# Patient Record
Sex: Female | Born: 1965 | Race: White | Hispanic: No | Marital: Married | Smoking: Never smoker
Health system: Southern US, Community
[De-identification: ages and names within clinical notes are randomized; demographics above are authoritative.]

## PROBLEM LIST (undated history)

## (undated) DIAGNOSIS — F509 Eating disorder, unspecified: Secondary | ICD-10-CM

## (undated) DIAGNOSIS — F32A Depression, unspecified: Secondary | ICD-10-CM

## (undated) DIAGNOSIS — E7800 Pure hypercholesterolemia, unspecified: Secondary | ICD-10-CM

## (undated) DIAGNOSIS — C439 Malignant melanoma of skin, unspecified: Secondary | ICD-10-CM

## (undated) DIAGNOSIS — K219 Gastro-esophageal reflux disease without esophagitis: Secondary | ICD-10-CM

## (undated) DIAGNOSIS — F329 Major depressive disorder, single episode, unspecified: Secondary | ICD-10-CM

## (undated) DIAGNOSIS — I10 Essential (primary) hypertension: Secondary | ICD-10-CM

## (undated) HISTORY — DX: Malignant melanoma of skin, unspecified: C43.9

## (undated) HISTORY — DX: Depression, unspecified: F32.A

## (undated) HISTORY — DX: Eating disorder, unspecified: F50.9

## (undated) HISTORY — DX: Gastro-esophageal reflux disease without esophagitis: K21.9

## (undated) HISTORY — PX: OTHER SURGICAL HISTORY: SHX169

---
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
## 1995-11-10 HISTORY — PX: TUBAL LIGATION: SHX77

## 2012-07-29 ENCOUNTER — Ambulatory Visit: Payer: 59

## 2012-07-29 ENCOUNTER — Ambulatory Visit (INDEPENDENT_AMBULATORY_CARE_PROVIDER_SITE_OTHER): Payer: 59 | Admitting: Family Medicine

## 2012-07-29 VITALS — BP 128/88 | HR 65 | Temp 98.2°F | Resp 16 | Ht 67.0 in | Wt 173.0 lb

## 2012-07-29 DIAGNOSIS — J9801 Acute bronchospasm: Secondary | ICD-10-CM

## 2012-07-29 DIAGNOSIS — S29011A Strain of muscle and tendon of front wall of thorax, initial encounter: Secondary | ICD-10-CM

## 2012-07-29 DIAGNOSIS — R05 Cough: Secondary | ICD-10-CM

## 2012-07-29 DIAGNOSIS — R059 Cough, unspecified: Secondary | ICD-10-CM

## 2012-07-29 DIAGNOSIS — S239XXA Sprain of unspecified parts of thorax, initial encounter: Secondary | ICD-10-CM

## 2012-07-29 DIAGNOSIS — J209 Acute bronchitis, unspecified: Secondary | ICD-10-CM

## 2012-08-01 ENCOUNTER — Encounter: Payer: Self-pay | Admitting: Family Medicine

## 2012-08-01 NOTE — Progress Notes (Signed)
[address]   [place], [place]  [address]   [phone]  Subjective:    Patient ID: Donna Vega, female    DOB: Jul 27, 1966, 46 y.o.   MRN: [id number]  HPIThis 46 y.o. female presents for evaluation of cough, chest pain.  Onset one week ago with chest congestion.  No fever/chills/sweats.  +HA mild; no sore throat or ear pain.  +PND; no rhinorrhea; +nasal congestion.  No sneezing; no history of seasonal allergies.  Works in Radio broadcast assistant.  No n/v/d. No rash.  Taking Dayquil.  +chest pain and upper back pain started three days ago; occurs with coughing, rolling over, lying in bed.  No pain with deep breathing.  Sharp pain.  No SOB/DOE.  No history of asthma; +wheezing with acute illness.  No leg swelling.    PCP: none.   PMH: none Psurg: BTL All: NKDA Medications: Topamax 50mg  bid started last week for overweight status by NP at [facility]. Social:  No tobacco/ETOH/drugs; works in Radio broadcast assistant.  No exercise..   Review of Systems  Constitutional: Negative for fever, chills and fatigue.  HENT: Positive for congestion and postnasal drip. Negative for sore throat, rhinorrhea, sneezing, trouble swallowing and voice change.   Respiratory: Positive for cough and wheezing. Negative for shortness of breath.   Cardiovascular: Positive for chest pain. Negative for palpitations and leg swelling.  Gastrointestinal: Negative for nausea, vomiting, diarrhea and constipation.  Skin: Negative for rash.  Neurological: Negative for headaches.    No past medical history on file.  Past Surgical History  Procedure Date  . Bilateral tubal liga     Prior to Admission medications   Medication Sig Start Date End Date Taking? Authorizing Provider  topiramate (TOPAMAX) 50 MG tablet Take 50 mg by mouth 2 (two) times daily.   Yes Historical Provider, MD    No Known Allergies  History   Social History  . Marital Status: Married    Spouse Name: N/A    Number of Children: N/A  . Years of  Education: N/A   Occupational History  . Not on file.   Social History Main Topics  . Smoking status: Never Smoker   . Smokeless tobacco: Not on file  . Alcohol Use: No  . Drug Use: No  . Sexually Active: Yes -- Female partner(s)   Other Topics Concern  . Not on file   Social History Narrative  . No narrative on file    No family history on file.     Objective:   Physical Exam  Nursing note and vitals reviewed. Constitutional: She is oriented to person, place, and time. She appears well-developed and well-nourished. No distress.  HENT:  Head: Normocephalic and atraumatic.  Right Ear: External ear normal.  Left Ear: External ear normal.  Nose: Nose normal.  Mouth/Throat: Oropharynx is clear and moist. No oropharyngeal exudate.  Eyes: Conjunctivae normal and EOM are normal. Pupils are equal, round, and reactive to light.  Neck: Normal range of motion. Neck supple. No thyromegaly present.  Cardiovascular: Normal rate, regular rhythm, normal heart sounds and intact distal pulses.  Exam reveals no gallop and no friction rub.   No murmur heard. Pulmonary/Chest: Effort normal and breath sounds normal. No respiratory distress. She has no wheezes. She has no rales.  Abdominal: Soft. Bowel sounds are normal. She exhibits no distension. There is no tenderness.  Lymphadenopathy:    She has no cervical adenopathy.  Neurological: She is alert and oriented to person, place, and  time.  Skin: Skin is warm and dry. She is not diaphoretic.  Psychiatric: She has a normal mood and affect. Her behavior is normal. Judgment and thought content normal.   [facility] reading (PRIMARY) by  Dr. Katrinka Blazing.  CXR: NAD  PROCEDURE: ALBUTEROL NEBULIZER X 1 ADMINISTERED DURING VISIT.     Assessment & Plan:   1. Cough  DG Chest 2 View  2. Acute bronchitis    3. Bronchospasm    4. Chest wall muscle strain       1.  Acute Bronchitis: New.  Rx for Doxycycline 100mg  bid x 10 days #20 no refills.  Mucinex DM  bid PRN cough.  RTC for acute worsening or lack of improvement. 2. Bronchospasm:  New.  S/p albuterol nebulizer in office. Rx for Prednisone 20mg  two daily x 5 days then one daily x 5 days #15 no refills. 3.  Chest wall muscle strain: New. Secondary to coughing; recommend Tylenol PRN.  Rx for Prednisone provided.

## 2012-08-02 NOTE — Progress Notes (Signed)
Reviewed and agree.

## 2012-08-05 ENCOUNTER — Encounter: Payer: Self-pay | Admitting: Family Medicine

## 2012-09-29 ENCOUNTER — Ambulatory Visit: Payer: Self-pay | Admitting: Family Medicine

## 2012-10-03 ENCOUNTER — Encounter: Payer: Self-pay | Admitting: Family Medicine

## 2012-10-03 ENCOUNTER — Ambulatory Visit (INDEPENDENT_AMBULATORY_CARE_PROVIDER_SITE_OTHER): Payer: 59 | Admitting: Family Medicine

## 2012-10-03 VITALS — BP 120/80 | HR 79 | Resp 15 | Ht 68.0 in | Wt 174.1 lb

## 2012-10-03 DIAGNOSIS — Z733 Stress, not elsewhere classified: Secondary | ICD-10-CM

## 2012-10-03 DIAGNOSIS — Z1239 Encounter for other screening for malignant neoplasm of breast: Secondary | ICD-10-CM

## 2012-10-03 DIAGNOSIS — F439 Reaction to severe stress, unspecified: Secondary | ICD-10-CM

## 2012-10-03 DIAGNOSIS — F509 Eating disorder, unspecified: Secondary | ICD-10-CM

## 2012-10-03 NOTE — Progress Notes (Signed)
  Subjective:    Patient ID: Donna Vega, female    DOB: 02/24/1966, 46 y.o.   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 She recently asked her GYN for help with weight loss and was prescribed topamax, they were not aware she had eating disorder, no weight was lost with the medication and she stopped it.  Does not exercise, her diet is not very well controlled, eats snack and junk food, drinks 2-3 sodas a day Her 50 year old daughter is having a baby unexpected and out of wedlock and this stresses her some She has LSIL on PAP and has to return every 6 months , has had colpo she does not want to go to doctor including GYN. Only scheduled with me as she had URI last month and had no PCP to see Also would like mammogram scheduled Had labs done with GYN and wellness program for work  LPN  Review of Systems  GEN- denies fatigue, fever, weight loss,weakness, recent illness HEENT- denies eye drainage, change in vision, nasal discharge, CVS- denies chest pain, palpitations RESP- denies SOB, cough, wheeze ABD- denies N/V, change in stools, abd pain GU- denies dysuria, hematuria, dribbling, incontinence MSK- denies joint pain, muscle aches, injury Neuro- denies headache, dizziness, syncope, seizure activity      Objective:   Physical Exam GEN- NAD, alert and oriented x3 HEENT- PERRL, EOMI, non injected sclera, pink conjunctiva, MMM, oropharynx clear Neck- Supple, no thyromegaly CVS- RRR, no murmur RESP-CTAB ABS-NABS,soft,NT,ND EXT- No edema Pulses- Radial,  Psych- a little anxious appearing, gets flushed when nervous, not depressed  appearing, not chronically ill appearing         Assessment & Plan:

## 2012-10-03 NOTE — Assessment & Plan Note (Signed)
She declines any therapy, counseling or treatment, her BMI is within the normal range, approx 15  Minutes spent on eating healthy foods and cutting out junk foods to maintain a good weight

## 2012-10-03 NOTE — Patient Instructions (Addendum)
Schedule your Mammogram I will get your records Send me Your records from cholesterol screen Remember you are a beautiful woman! F/U yearly or as needed

## 2012-10-03 NOTE — Assessment & Plan Note (Signed)
New stressor with daughters pregnancy. She was treated for depression in the past after a failed marriage There is also some stress at work.

## 2012-11-22 ENCOUNTER — Telehealth: Payer: Self-pay | Admitting: Family Medicine

## 2012-11-25 NOTE — Telephone Encounter (Signed)
Called and left message for patient to return call Needs appointment.

## 2016-05-11 DIAGNOSIS — E6609 Other obesity due to excess calories: Secondary | ICD-10-CM | POA: Diagnosis not present

## 2016-05-11 DIAGNOSIS — Z0000 Encounter for general adult medical examination without abnormal findings: Secondary | ICD-10-CM | POA: Diagnosis not present

## 2016-05-15 DIAGNOSIS — E6609 Other obesity due to excess calories: Secondary | ICD-10-CM | POA: Diagnosis not present

## 2016-05-15 DIAGNOSIS — Z0000 Encounter for general adult medical examination without abnormal findings: Secondary | ICD-10-CM | POA: Diagnosis not present

## 2016-05-22 DIAGNOSIS — E782 Mixed hyperlipidemia: Secondary | ICD-10-CM | POA: Diagnosis not present

## 2016-05-25 DIAGNOSIS — Z0000 Encounter for general adult medical examination without abnormal findings: Secondary | ICD-10-CM | POA: Diagnosis not present

## 2016-08-17 DIAGNOSIS — Z1151 Encounter for screening for human papillomavirus (HPV): Secondary | ICD-10-CM | POA: Diagnosis not present

## 2016-08-17 DIAGNOSIS — Z01419 Encounter for gynecological examination (general) (routine) without abnormal findings: Secondary | ICD-10-CM | POA: Diagnosis not present

## 2016-08-17 DIAGNOSIS — Z6830 Body mass index (BMI) 30.0-30.9, adult: Secondary | ICD-10-CM | POA: Diagnosis not present

## 2016-08-26 DIAGNOSIS — Z0189 Encounter for other specified special examinations: Secondary | ICD-10-CM | POA: Diagnosis not present

## 2016-08-26 DIAGNOSIS — E6609 Other obesity due to excess calories: Secondary | ICD-10-CM | POA: Diagnosis not present

## 2016-11-16 DIAGNOSIS — Z6830 Body mass index (BMI) 30.0-30.9, adult: Secondary | ICD-10-CM | POA: Diagnosis not present

## 2016-11-16 DIAGNOSIS — Z713 Dietary counseling and surveillance: Secondary | ICD-10-CM | POA: Diagnosis not present

## 2016-12-02 ENCOUNTER — Other Ambulatory Visit (HOSPITAL_COMMUNITY): Payer: Self-pay | Admitting: Internal Medicine

## 2016-12-02 DIAGNOSIS — Z1231 Encounter for screening mammogram for malignant neoplasm of breast: Secondary | ICD-10-CM

## 2016-12-07 ENCOUNTER — Ambulatory Visit (HOSPITAL_COMMUNITY): Payer: 59

## 2017-02-26 ENCOUNTER — Encounter (HOSPITAL_COMMUNITY): Payer: Self-pay | Admitting: Radiology

## 2017-02-26 ENCOUNTER — Ambulatory Visit (HOSPITAL_COMMUNITY)
Admission: RE | Admit: 2017-02-26 | Discharge: 2017-02-26 | Disposition: A | Discharge status: Home / Self Care | Payer: 59 | Source: Ambulatory Visit | Attending: Internal Medicine | Admitting: Internal Medicine

## 2017-02-26 DIAGNOSIS — Z1231 Encounter for screening mammogram for malignant neoplasm of breast: Secondary | ICD-10-CM | POA: Diagnosis not present

## 2017-06-02 DIAGNOSIS — Z0000 Encounter for general adult medical examination without abnormal findings: Secondary | ICD-10-CM | POA: Diagnosis not present

## 2017-06-25 DIAGNOSIS — E782 Mixed hyperlipidemia: Secondary | ICD-10-CM | POA: Diagnosis not present

## 2017-06-25 DIAGNOSIS — Z0001 Encounter for general adult medical examination with abnormal findings: Secondary | ICD-10-CM | POA: Diagnosis not present

## 2017-06-25 DIAGNOSIS — Z6829 Body mass index (BMI) 29.0-29.9, adult: Secondary | ICD-10-CM | POA: Diagnosis not present

## 2017-06-25 DIAGNOSIS — Z713 Dietary counseling and surveillance: Secondary | ICD-10-CM | POA: Diagnosis not present

## 2017-08-04 DIAGNOSIS — N951 Menopausal and female climacteric states: Secondary | ICD-10-CM | POA: Diagnosis not present

## 2017-08-04 DIAGNOSIS — Z6829 Body mass index (BMI) 29.0-29.9, adult: Secondary | ICD-10-CM | POA: Diagnosis not present

## 2017-08-04 DIAGNOSIS — Z713 Dietary counseling and surveillance: Secondary | ICD-10-CM | POA: Diagnosis not present

## 2017-09-06 DIAGNOSIS — N951 Menopausal and female climacteric states: Secondary | ICD-10-CM | POA: Diagnosis not present

## 2017-10-05 DIAGNOSIS — N951 Menopausal and female climacteric states: Secondary | ICD-10-CM | POA: Diagnosis not present

## 2017-10-05 DIAGNOSIS — Z6830 Body mass index (BMI) 30.0-30.9, adult: Secondary | ICD-10-CM | POA: Diagnosis not present

## 2017-10-05 DIAGNOSIS — F3489 Other specified persistent mood disorders: Secondary | ICD-10-CM | POA: Diagnosis not present

## 2017-12-17 DIAGNOSIS — N951 Menopausal and female climacteric states: Secondary | ICD-10-CM | POA: Diagnosis not present

## 2017-12-17 DIAGNOSIS — Z713 Dietary counseling and surveillance: Secondary | ICD-10-CM | POA: Diagnosis not present

## 2017-12-17 DIAGNOSIS — Z6830 Body mass index (BMI) 30.0-30.9, adult: Secondary | ICD-10-CM | POA: Diagnosis not present

## 2017-12-31 DIAGNOSIS — Z01419 Encounter for gynecological examination (general) (routine) without abnormal findings: Secondary | ICD-10-CM | POA: Diagnosis not present

## 2017-12-31 DIAGNOSIS — D2272 Melanocytic nevi of left lower limb, including hip: Secondary | ICD-10-CM | POA: Diagnosis not present

## 2017-12-31 DIAGNOSIS — N951 Menopausal and female climacteric states: Secondary | ICD-10-CM | POA: Diagnosis not present

## 2018-02-21 DIAGNOSIS — C4372 Malignant melanoma of left lower limb, including hip: Secondary | ICD-10-CM | POA: Diagnosis not present

## 2018-03-07 DIAGNOSIS — C4372 Malignant melanoma of left lower limb, including hip: Secondary | ICD-10-CM | POA: Diagnosis not present

## 2018-03-07 DIAGNOSIS — D487 Neoplasm of uncertain behavior of other specified sites: Secondary | ICD-10-CM | POA: Diagnosis not present

## 2018-03-25 DIAGNOSIS — E782 Mixed hyperlipidemia: Secondary | ICD-10-CM | POA: Diagnosis not present

## 2018-03-25 DIAGNOSIS — I10 Essential (primary) hypertension: Secondary | ICD-10-CM | POA: Diagnosis not present

## 2018-03-30 DIAGNOSIS — N951 Menopausal and female climacteric states: Secondary | ICD-10-CM | POA: Diagnosis not present

## 2018-03-30 DIAGNOSIS — E782 Mixed hyperlipidemia: Secondary | ICD-10-CM | POA: Diagnosis not present

## 2018-03-30 DIAGNOSIS — F331 Major depressive disorder, recurrent, moderate: Secondary | ICD-10-CM | POA: Diagnosis not present

## 2018-04-29 DIAGNOSIS — I10 Essential (primary) hypertension: Secondary | ICD-10-CM | POA: Diagnosis not present

## 2018-04-29 DIAGNOSIS — E782 Mixed hyperlipidemia: Secondary | ICD-10-CM | POA: Diagnosis not present

## 2018-04-29 DIAGNOSIS — F331 Major depressive disorder, recurrent, moderate: Secondary | ICD-10-CM | POA: Diagnosis not present

## 2018-04-29 DIAGNOSIS — N951 Menopausal and female climacteric states: Secondary | ICD-10-CM | POA: Diagnosis not present

## 2018-05-20 DIAGNOSIS — F331 Major depressive disorder, recurrent, moderate: Secondary | ICD-10-CM | POA: Diagnosis not present

## 2018-05-20 DIAGNOSIS — N951 Menopausal and female climacteric states: Secondary | ICD-10-CM | POA: Diagnosis not present

## 2018-05-20 DIAGNOSIS — Z6833 Body mass index (BMI) 33.0-33.9, adult: Secondary | ICD-10-CM | POA: Diagnosis not present

## 2018-05-20 DIAGNOSIS — F3489 Other specified persistent mood disorders: Secondary | ICD-10-CM | POA: Diagnosis not present

## 2019-07-10 DIAGNOSIS — Z85820 Personal history of malignant melanoma of skin: Secondary | ICD-10-CM | POA: Diagnosis not present

## 2019-07-10 DIAGNOSIS — F329 Major depressive disorder, single episode, unspecified: Secondary | ICD-10-CM | POA: Diagnosis present

## 2019-07-10 DIAGNOSIS — F32A Depression, unspecified: Secondary | ICD-10-CM | POA: Diagnosis present

## 2019-07-10 DIAGNOSIS — Z01411 Encounter for gynecological examination (general) (routine) with abnormal findings: Secondary | ICD-10-CM | POA: Diagnosis not present

## 2019-08-07 DIAGNOSIS — Z6834 Body mass index (BMI) 34.0-34.9, adult: Secondary | ICD-10-CM | POA: Diagnosis not present

## 2019-08-07 DIAGNOSIS — E669 Obesity, unspecified: Secondary | ICD-10-CM | POA: Diagnosis not present

## 2019-08-07 DIAGNOSIS — F329 Major depressive disorder, single episode, unspecified: Secondary | ICD-10-CM | POA: Diagnosis not present

## 2019-08-07 DIAGNOSIS — N951 Menopausal and female climacteric states: Secondary | ICD-10-CM | POA: Diagnosis not present

## 2019-09-04 DIAGNOSIS — Z6832 Body mass index (BMI) 32.0-32.9, adult: Secondary | ICD-10-CM | POA: Diagnosis not present

## 2019-09-04 DIAGNOSIS — F329 Major depressive disorder, single episode, unspecified: Secondary | ICD-10-CM | POA: Diagnosis not present

## 2019-09-04 DIAGNOSIS — K2970 Gastritis, unspecified, without bleeding: Secondary | ICD-10-CM | POA: Diagnosis not present

## 2019-09-04 DIAGNOSIS — E669 Obesity, unspecified: Secondary | ICD-10-CM | POA: Diagnosis not present

## 2019-09-05 ENCOUNTER — Encounter: Payer: Self-pay | Admitting: Internal Medicine

## 2019-09-20 ENCOUNTER — Ambulatory Visit: Payer: 59 | Admitting: Gastroenterology

## 2019-09-20 ENCOUNTER — Other Ambulatory Visit: Payer: Self-pay

## 2019-09-20 ENCOUNTER — Encounter: Payer: Self-pay | Admitting: Gastroenterology

## 2019-09-20 ENCOUNTER — Telehealth: Payer: Self-pay | Admitting: *Deleted

## 2019-09-20 ENCOUNTER — Encounter: Payer: Self-pay | Admitting: *Deleted

## 2019-09-20 VITALS — BP 151/97 | HR 61 | Temp 97.2°F | Ht 68.0 in | Wt 213.2 lb

## 2019-09-20 DIAGNOSIS — R1013 Epigastric pain: Secondary | ICD-10-CM | POA: Diagnosis not present

## 2019-09-20 MED ORDER — ONDANSETRON 4 MG PO TBDP: 4.0000 mg | ORAL_TABLET | Freq: Three times a day (TID) | ORAL | 1 refills | Status: DC | PRN

## 2019-09-20 NOTE — Patient Instructions (Signed)
Please have labs completed today. We will call with results.  We are ordering a CT to be done in the next 24-48 hours.   Continue Protonix as you are doing.  I have sent in Zofran for nausea to take every 8 hours as needed.  We will be in close contact with next steps!  It was a pleasure to see you today. I want to create trusting relationships with patients to provide genuine, compassionate, and quality care. I value your feedback. If you receive a survey regarding your visit,  I greatly appreciate you taking time to fill this out.   Annitta Needs, PhD, ANP-BC [facility]

## 2019-09-20 NOTE — Progress Notes (Signed)
Primary Care Physician:  Donna Fantasia, NP  Referring Provider: Bryan Lemma, NP Primary Gastroenterologist:  Dr. Gala Donna Vega   Chief Complaint  Patient presents with  . GASTRITIS    F/U.    HPI:   Donna Donna Vega is a 53 y.o. female presenting today at the request of Donna Lemma, NP, due to abdominal pain and concern for gastritis.   Notes onset of epigastric pain one month ago. Intermittently occurring and unable to lay on either side when pain hits. If lays on left side will vomit. Associated nausea with eating. Decreased appetite. Self-reported weight loss of 12 lbs over the past month. Notes intermittent pruritis, no rash. No jaundice. Feels fatigued and tired. Several days ago noted gray/white colored stool that has persisted. Stool floats. Urine is dark intermittently. Stool floats. No changes in bowel habits. Chronic GERD with Protonix providing relief to reflux symptoms. Pain usually starts later in the evening prior to going to bed. Feels very fatigued and tired.   No recent labs. No imaging on file. No prior colonoscopy/EGD. Mom with history of colon cancer in her 85s. No family history of liver disease.   Past Medical History:  Diagnosis Date  . Depression   . Eating disorder   . GERD (gastroesophageal reflux disease)   . 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 TUBAL LIGATION  1997    Current Outpatient Medications  Medication Sig Dispense Refill  . pantoprazole (PROTONIX) 20 MG tablet Take 1 tablet by mouth daily.    Marland Kitchen PARoxetine (PAXIL) 20 MG tablet Take 1 tablet by mouth daily.    . ondansetron (ZOFRAN ODT) 4 MG disintegrating tablet Take 1 tablet (4 mg total) by mouth every 8 (eight) hours as needed for nausea or vomiting. 60 tablet 1   No current facility-administered medications for this visit.     Allergies as of 09/20/2019  . (No Known Allergies)    Family History  Problem Relation Age of Onset  . Colon  cancer Mother 4       colon   . Hypertension Father   . Hyperlipidemia Father   . Liver disease Neg Hx     Social History   Socioeconomic History  . Marital status: Married    Spouse name: Not on file  . Number of children: Not on file  . Years of education: Not on file  . Highest education level: Not on file  Occupational History  . Not on file  Social Donna Vega  . Financial resource strain: Not on file  . Food insecurity    Worry: Not on file    Inability: Not on file  . Transportation Donna Vega    Medical: Not on file    Non-medical: Not on file  Tobacco Use  . Smoking status: Never Smoker  . Smokeless tobacco: Never Used  Substance and Sexual Activity  . Alcohol use: Not Currently    Comment: 2-3 beers after each evening for 6 years  . Drug use: No  . Sexual activity: Yes    Partners: Male  Lifestyle  . Physical activity    Days per week: Not on file    Minutes per session: Not on file  . Stress: Not on file  Relationships  . Social Herbalist on phone: Not on file    Gets together: Not on file    Attends religious service: Not on file  Active member of club or organization: Not on file    Attends meetings of clubs or organizations: Not on file    Relationship status: Not on file  . Intimate partner violence    Fear of current or ex partner: Not on file    Emotionally abused: Not on file    Physically abused: Not on file    Forced sexual activity: Not on file  Other Topics Concern  . Not on file  Social History Narrative  . Not on file    Review of Systems: Gen: see HPI CV: Denies chest pain, heart palpitations, peripheral edema, syncope.  Resp: Denies shortness of breath at rest or with exertion. Denies wheezing or cough.  GI: see HPI GU : Denies urinary burning, urinary frequency, urinary hesitancy MS: Denies joint pain, muscle weakness, cramps, or limitation of movement.  Derm: see HPI Psych: Denies depression, anxiety, memory loss, and  confusion Heme: Denies bruising, bleeding, and enlarged lymph nodes.  Physical Exam: BP (!) 151/97   Pulse 61   Temp (!) 97.2 F (36.2 C) (Oral)   Ht 5\' 8"  (1.727 m)   Wt 213 lb 3.2 oz (96.7 kg)   BMI 32.42 kg/m  General:   Alert and oriented. Pleasant and cooperative. Well-nourished and well-developed.  Head:  Normocephalic and atraumatic. Eyes:  Without icterus, sclera clear and conjunctiva pink.  Ears:  Normal auditory acuity. Lungs:  Clear to auscultation bilaterally. No wheezes, rales, or rhonchi. No distress.  Heart:  S1, S2 present without murmurs appreciated.  Abdomen:  +BS, soft, non-tender and non-distended. No HSM noted. No guarding or rebound. No masses appreciated.  Rectal:  Deferred  Msk:  Symmetrical without gross deformities. Normal posture. Extremities:  Without edema. Neurologic:  Alert and  oriented x4 Psych:  Alert and cooperative. Normal mood and affect.  ASSESSMENT/PLAN: Donna Donna Vega is a 53 y.o. female presenting today with acute onset of epigastric pain, nausea, vomiting, loss of appetite one month ago, now reporting clay/white-colored stools, pruritis, intermittently dark urine. Self-reported weight loss of 12 lbs recently. No recent labs on file. No imaging thus far. Family history significant for colon cancer in mother in her 62s. Patient with personal history of left leg melanoma. No known liver disease.  Presentation concerning. Ordering CBC, BMP, HFP, and pursuing stat CT abd/pelvis. Zofran provided for nausea. Further recommendations to follow once we receive labs and imaging.   Donna Needs, PhD, ANP-BC [facility]

## 2019-09-20 NOTE — Telephone Encounter (Signed)
Called [facility] and was advised no PA required for CT A/P.

## 2019-09-21 ENCOUNTER — Ambulatory Visit (HOSPITAL_COMMUNITY)
Admission: RE | Admit: 2019-09-21 | Discharge: 2019-09-21 | Disposition: A | Discharge status: Home / Self Care | Payer: 59 | Source: Ambulatory Visit | Attending: Gastroenterology | Admitting: Gastroenterology

## 2019-09-21 DIAGNOSIS — R1110 Vomiting, unspecified: Secondary | ICD-10-CM | POA: Diagnosis not present

## 2019-09-21 DIAGNOSIS — R1013 Epigastric pain: Secondary | ICD-10-CM | POA: Insufficient documentation

## 2019-09-21 LAB — CBC WITH DIFFERENTIAL/PLATELET
Absolute Monocytes: 450 cells/uL (ref 200–950)
Basophils Absolute: 48 cells/uL (ref 0–200)
Basophils Relative: 0.8 %
Eosinophils Absolute: 120 cells/uL (ref 15–500)
Eosinophils Relative: 2 %
HCT: 46.2 % — ABNORMAL HIGH (ref 35.0–45.0)
Hemoglobin: 15.4 g/dL (ref 11.7–15.5)
Lymphs Abs: 1746 cells/uL (ref 850–3900)
MCH: 29.8 pg (ref 27.0–33.0)
MCHC: 33.3 g/dL (ref 32.0–36.0)
MCV: 89.4 fL (ref 80.0–100.0)
MPV: 11.1 fL (ref 7.5–12.5)
Monocytes Relative: 7.5 %
Neutro Abs: 3636 cells/uL (ref 1500–7800)
Neutrophils Relative %: 60.6 %
Platelets: 241 10*3/uL (ref 140–400)
RBC: 5.17 10*6/uL — ABNORMAL HIGH (ref 3.80–5.10)
RDW: 12.3 % (ref 11.0–15.0)
Total Lymphocyte: 29.1 %
WBC: 6 10*3/uL (ref 3.8–10.8)

## 2019-09-21 LAB — HEPATIC FUNCTION PANEL
AG Ratio: 1.5 (calc) (ref 1.0–2.5)
ALT: 259 U/L — ABNORMAL HIGH (ref 6–29)
AST: 107 U/L — ABNORMAL HIGH (ref 10–35)
Albumin: 4.6 g/dL (ref 3.6–5.1)
Alkaline phosphatase (APISO): 353 U/L — ABNORMAL HIGH (ref 37–153)
Bilirubin, Direct: 0.3 mg/dL — ABNORMAL HIGH (ref 0.0–0.2)
Globulin: 3.1 g/dL (calc) (ref 1.9–3.7)
Indirect Bilirubin: 0.7 mg/dL (calc) (ref 0.2–1.2)
Total Bilirubin: 1 mg/dL (ref 0.2–1.2)
Total Protein: 7.7 g/dL (ref 6.1–8.1)

## 2019-09-21 LAB — BASIC METABOLIC PANEL WITH GFR
BUN: 14 mg/dL (ref 7–25)
CO2: 25 mmol/L (ref 20–32)
Calcium: 9.9 mg/dL (ref 8.6–10.4)
Chloride: 106 mmol/L (ref 98–110)
Creat: 0.82 mg/dL (ref 0.50–1.05)
GFR, Est African American: 96 mL/min/{1.73_m2} (ref >=60)
GFR, Est Non African American: 83 mL/min/{1.73_m2} (ref >=60)
Glucose, Bld: 109 mg/dL (ref 65–139)
Potassium: 4.8 mmol/L (ref 3.5–5.3)
Sodium: 138 mmol/L (ref 135–146)

## 2019-09-21 MED ORDER — IOHEXOL 300 MG/ML  SOLN
100.0000 mL | Freq: Once | INTRAMUSCULAR | Status: AC | PRN
  Administered 2019-09-21: 100 mL via INTRAVENOUS

## 2019-09-25 ENCOUNTER — Other Ambulatory Visit: Payer: Self-pay

## 2019-09-25 ENCOUNTER — Other Ambulatory Visit: Payer: Self-pay | Admitting: Gastroenterology

## 2019-09-25 ENCOUNTER — Other Ambulatory Visit: Payer: Self-pay | Admitting: *Deleted

## 2019-09-25 DIAGNOSIS — R1013 Epigastric pain: Secondary | ICD-10-CM

## 2019-09-26 ENCOUNTER — Other Ambulatory Visit: Payer: Self-pay | Admitting: *Deleted

## 2019-09-26 ENCOUNTER — Encounter: Payer: Self-pay | Admitting: *Deleted

## 2019-09-26 ENCOUNTER — Telehealth: Payer: Self-pay | Admitting: *Deleted

## 2019-09-26 DIAGNOSIS — R634 Abnormal weight loss: Secondary | ICD-10-CM

## 2019-09-26 DIAGNOSIS — R7989 Other specified abnormal findings of blood chemistry: Secondary | ICD-10-CM

## 2019-09-26 NOTE — Progress Notes (Signed)
Donna Vega, I think we need to get an MRI/MRCP to further evaluate bile duct, pancreas, especially in light of elevated LFTs, reports of clay-colored stool, weight loss. [facility] clinical pool, please arrange this week if at all possible.

## 2019-09-26 NOTE — Telephone Encounter (Signed)
Called [facility]. Spoke with Raquel. Was advised no PA is required for MRI/MRCP. Ref# [id number] at 12:39pm

## 2019-09-27 ENCOUNTER — Other Ambulatory Visit: Payer: Self-pay | Admitting: *Deleted

## 2019-09-27 ENCOUNTER — Telehealth: Payer: Self-pay | Admitting: Gastroenterology

## 2019-09-27 DIAGNOSIS — R7989 Other specified abnormal findings of blood chemistry: Secondary | ICD-10-CM | POA: Diagnosis not present

## 2019-09-27 NOTE — Telephone Encounter (Signed)
[facility] clinical pool: is there any way we can get MRI/MRCP tomorrow? I am recommending repeat HFP and lipase today to see trend.   Alicia: can we have her complete HFP and lipase today? I apologize for all the blood work, but we have only last week's blood work to compare this to, and it will be helpful to see what LFTs are doing in this scenario.

## 2019-09-27 NOTE — Telephone Encounter (Signed)
Noted. Lab orders placed by MS.

## 2019-09-27 NOTE — Addendum Note (Signed)
Addended by: Cheron Every on: 09/27/2019 01:39 PM   Modules accepted: Orders

## 2019-09-27 NOTE — Telephone Encounter (Signed)
MRI/MRCP has been moved up to 11/19 at 4:00pm, arrival 3:30pm, npo 4 hrs. Patient will go today for lab work. Orders placed.

## 2019-09-28 ENCOUNTER — Other Ambulatory Visit: Payer: Self-pay

## 2019-09-28 ENCOUNTER — Emergency Department (HOSPITAL_COMMUNITY): Payer: 59

## 2019-09-28 ENCOUNTER — Encounter (HOSPITAL_COMMUNITY): Payer: Self-pay | Admitting: *Deleted

## 2019-09-28 ENCOUNTER — Inpatient Hospital Stay (HOSPITAL_COMMUNITY)
Admission: EM | Admit: 2019-09-28 | Discharge: 2019-10-04 | DRG: 439 | Disposition: A | Discharge status: Home / Self Care | Payer: 59 | Attending: Internal Medicine | Admitting: Internal Medicine

## 2019-09-28 ENCOUNTER — Ambulatory Visit (HOSPITAL_COMMUNITY): Payer: 59

## 2019-09-28 ENCOUNTER — Telehealth: Payer: Self-pay | Admitting: Gastroenterology

## 2019-09-28 DIAGNOSIS — E871 Hypo-osmolality and hyponatremia: Secondary | ICD-10-CM | POA: Diagnosis present

## 2019-09-28 DIAGNOSIS — R109 Unspecified abdominal pain: Secondary | ICD-10-CM

## 2019-09-28 DIAGNOSIS — K851 Biliary acute pancreatitis without necrosis or infection: Principal | ICD-10-CM | POA: Diagnosis present

## 2019-09-28 DIAGNOSIS — Z85820 Personal history of malignant melanoma of skin: Secondary | ICD-10-CM | POA: Diagnosis not present

## 2019-09-28 DIAGNOSIS — K8051 Calculus of bile duct without cholangitis or cholecystitis with obstruction: Secondary | ICD-10-CM | POA: Diagnosis present

## 2019-09-28 DIAGNOSIS — E876 Hypokalemia: Secondary | ICD-10-CM | POA: Diagnosis present

## 2019-09-28 DIAGNOSIS — K219 Gastro-esophageal reflux disease without esophagitis: Secondary | ICD-10-CM | POA: Diagnosis not present

## 2019-09-28 DIAGNOSIS — K8050 Calculus of bile duct without cholangitis or cholecystitis without obstruction: Secondary | ICD-10-CM | POA: Diagnosis not present

## 2019-09-28 DIAGNOSIS — E8770 Fluid overload, unspecified: Secondary | ICD-10-CM | POA: Diagnosis present

## 2019-09-28 DIAGNOSIS — R945 Abnormal results of liver function studies: Secondary | ICD-10-CM | POA: Diagnosis present

## 2019-09-28 DIAGNOSIS — F329 Major depressive disorder, single episode, unspecified: Secondary | ICD-10-CM | POA: Diagnosis not present

## 2019-09-28 DIAGNOSIS — K838 Other specified diseases of biliary tract: Secondary | ICD-10-CM | POA: Diagnosis not present

## 2019-09-28 DIAGNOSIS — J90 Pleural effusion, not elsewhere classified: Secondary | ICD-10-CM | POA: Diagnosis not present

## 2019-09-28 DIAGNOSIS — C439 Malignant melanoma of skin, unspecified: Secondary | ICD-10-CM | POA: Diagnosis not present

## 2019-09-28 DIAGNOSIS — K859 Acute pancreatitis without necrosis or infection, unspecified: Secondary | ICD-10-CM | POA: Diagnosis not present

## 2019-09-28 DIAGNOSIS — Z20828 Contact with and (suspected) exposure to other viral communicable diseases: Secondary | ICD-10-CM | POA: Diagnosis not present

## 2019-09-28 DIAGNOSIS — F32A Depression, unspecified: Secondary | ICD-10-CM | POA: Diagnosis present

## 2019-09-28 DIAGNOSIS — R0600 Dyspnea, unspecified: Secondary | ICD-10-CM

## 2019-09-28 DIAGNOSIS — R1013 Epigastric pain: Secondary | ICD-10-CM | POA: Diagnosis not present

## 2019-09-28 LAB — COMPREHENSIVE METABOLIC PANEL
ALT: 468 U/L — ABNORMAL HIGH (ref 0–44)
AST: 330 U/L — ABNORMAL HIGH (ref 15–41)
Albumin: 4.2 g/dL (ref 3.5–5.0)
Alkaline Phosphatase: 540 U/L — ABNORMAL HIGH (ref 38–126)
Anion gap: 11 (ref 5–15)
BUN: 20 mg/dL (ref 6–20)
CO2: 26 mmol/L (ref 22–32)
Calcium: 9.1 mg/dL (ref 8.9–10.3)
Chloride: 100 mmol/L (ref 98–111)
Creatinine, Ser: 0.84 mg/dL (ref 0.44–1.00)
GFR calc Af Amer: >60 mL/min (ref >60)
GFR calc non Af Amer: >60 mL/min (ref >60)
Glucose, Bld: 175 mg/dL — ABNORMAL HIGH (ref 70–99)
Potassium: 3.9 mmol/L (ref 3.5–5.1)
Sodium: 137 mmol/L (ref 135–145)
Total Bilirubin: 5.8 mg/dL — ABNORMAL HIGH (ref 0.3–1.2)
Total Protein: 8.4 g/dL — ABNORMAL HIGH (ref 6.5–8.1)

## 2019-09-28 LAB — HEPATITIS B SURFACE ANTIGEN: Hepatitis B Surface Ag: NONREACTIVE

## 2019-09-28 LAB — CBC WITH DIFFERENTIAL/PLATELET
Abs Immature Granulocytes: 0.05 10*3/uL (ref 0.00–0.07)
Basophils Absolute: 0 10*3/uL (ref 0.0–0.1)
Basophils Relative: 0 %
Eosinophils Absolute: 0 10*3/uL (ref 0.0–0.5)
Eosinophils Relative: 0 %
HCT: 50 % — ABNORMAL HIGH (ref 36.0–46.0)
Hemoglobin: 16.1 g/dL — ABNORMAL HIGH (ref 12.0–15.0)
Immature Granulocytes: 0 %
Lymphocytes Relative: 4 %
Lymphs Abs: 0.5 10*3/uL — ABNORMAL LOW (ref 0.7–4.0)
MCH: 29.8 pg (ref 26.0–34.0)
MCHC: 32.2 g/dL (ref 30.0–36.0)
MCV: 92.6 fL (ref 80.0–100.0)
Monocytes Absolute: 0.5 10*3/uL (ref 0.1–1.0)
Monocytes Relative: 4 %
Neutro Abs: 11.1 10*3/uL — ABNORMAL HIGH (ref 1.7–7.7)
Neutrophils Relative %: 92 %
Platelets: 212 10*3/uL (ref 150–400)
RBC: 5.4 MIL/uL — ABNORMAL HIGH (ref 3.87–5.11)
RDW: 12.4 % (ref 11.5–15.5)
WBC: 12.2 10*3/uL — ABNORMAL HIGH (ref 4.0–10.5)
nRBC: 0 % (ref 0.0–0.2)

## 2019-09-28 LAB — CBC
HCT: 50 % — ABNORMAL HIGH (ref 36.0–46.0)
Hemoglobin: 16 g/dL — ABNORMAL HIGH (ref 12.0–15.0)
MCH: 30 pg (ref 26.0–34.0)
MCHC: 32 g/dL (ref 30.0–36.0)
MCV: 93.6 fL (ref 80.0–100.0)
Platelets: 195 10*3/uL (ref 150–400)
RBC: 5.34 MIL/uL — ABNORMAL HIGH (ref 3.87–5.11)
RDW: 12.8 % (ref 11.5–15.5)
WBC: 17.2 10*3/uL — ABNORMAL HIGH (ref 4.0–10.5)
nRBC: 0 % (ref 0.0–0.2)

## 2019-09-28 LAB — BASIC METABOLIC PANEL
Anion gap: 12 (ref 5–15)
BUN: 19 mg/dL (ref 6–20)
CO2: 27 mmol/L (ref 22–32)
Calcium: 8.7 mg/dL — ABNORMAL LOW (ref 8.9–10.3)
Chloride: 102 mmol/L (ref 98–111)
Creatinine, Ser: 0.74 mg/dL (ref 0.44–1.00)
GFR calc Af Amer: >60 mL/min (ref >60)
GFR calc non Af Amer: >60 mL/min (ref >60)
Glucose, Bld: 132 mg/dL — ABNORMAL HIGH (ref 70–99)
Potassium: 4.2 mmol/L (ref 3.5–5.1)
Sodium: 141 mmol/L (ref 135–145)

## 2019-09-28 LAB — ANTI-NUCLEAR AB-TITER (ANA TITER): ANA Titer 1: 1:40 {titer} — ABNORMAL HIGH

## 2019-09-28 LAB — LIPID PANEL
Cholesterol: 265 mg/dL — ABNORMAL HIGH (ref 0–200)
HDL: 82 mg/dL (ref >40)
LDL Cholesterol: 174 mg/dL — ABNORMAL HIGH (ref 0–99)
Total CHOL/HDL Ratio: 3.2 RATIO
Triglycerides: 45 mg/dL (ref <150)
VLDL: 9 mg/dL (ref 0–40)

## 2019-09-28 LAB — IGG, IGA, IGM
IgG (Immunoglobin G), Serum: 1132 mg/dL (ref 600–1640)
IgM, Serum: 155 mg/dL (ref 50–300)
Immunoglobulin A: 188 mg/dL (ref 47–310)

## 2019-09-28 LAB — SARS CORONAVIRUS 2 BY RT PCR (HOSPITAL ORDER, PERFORMED IN ~~LOC~~ HOSPITAL LAB): SARS Coronavirus 2: NEGATIVE

## 2019-09-28 LAB — HEPATITIS C ANTIBODY
Hepatitis C Ab: NONREACTIVE
SIGNAL TO CUT-OFF: 0.03 (ref <1.00)

## 2019-09-28 LAB — ANTI-SMOOTH MUSCLE ANTIBODY, IGG: Actin (Smooth Muscle) Antibody (IGG): <20 U (ref <20)

## 2019-09-28 LAB — ANA: Anti Nuclear Antibody (ANA): POSITIVE — AB

## 2019-09-28 LAB — LACTATE DEHYDROGENASE: LDH: 415 U/L — ABNORMAL HIGH (ref 98–192)

## 2019-09-28 LAB — MITOCHONDRIAL ANTIBODIES: Mitochondrial M2 Ab, IgG: <=20 U

## 2019-09-28 LAB — ETHANOL: Alcohol, Ethyl (B): <10 mg/dL (ref <10)

## 2019-09-28 LAB — LIPASE, BLOOD: Lipase: 3832 U/L — ABNORMAL HIGH (ref 11–51)

## 2019-09-28 MED ORDER — ACETAMINOPHEN 650 MG RE SUPP: 650.0000 mg | Freq: Four times a day (QID) | RECTAL | Status: DC | PRN

## 2019-09-28 MED ORDER — ONDANSETRON HCL 4 MG/2ML IJ SOLN
4.0000 mg | Freq: Once | INTRAMUSCULAR | Status: AC
  Administered 2019-09-28: 4 mg via INTRAVENOUS
  Filled 2019-09-28: qty 2

## 2019-09-28 MED ORDER — SODIUM CHLORIDE 0.9 % IV BOLUS
1000.0000 mL | Freq: Once | INTRAVENOUS | Status: AC
  Administered 2019-09-28: 1000 mL via INTRAVENOUS

## 2019-09-28 MED ORDER — ONDANSETRON HCL 4 MG/2ML IJ SOLN
INTRAMUSCULAR | Status: AC
  Filled 2019-09-28: qty 2

## 2019-09-28 MED ORDER — IOHEXOL 300 MG/ML  SOLN
100.0000 mL | Freq: Once | INTRAMUSCULAR | Status: AC | PRN
  Administered 2019-09-28: 100 mL via INTRAVENOUS

## 2019-09-28 MED ORDER — ACETAMINOPHEN 325 MG PO TABS
650.0000 mg | ORAL_TABLET | Freq: Four times a day (QID) | ORAL | Status: DC | PRN
  Administered 2019-09-29 – 2019-09-30 (×2): 650 mg via ORAL
  Filled 2019-09-28 (×2): qty 2

## 2019-09-28 MED ORDER — FENTANYL CITRATE (PF) 100 MCG/2ML IJ SOLN
INTRAMUSCULAR | Status: AC
  Filled 2019-09-28: qty 2

## 2019-09-28 MED ORDER — DIPHENHYDRAMINE HCL 50 MG/ML IJ SOLN: 25.0000 mg | Freq: Four times a day (QID) | INTRAMUSCULAR | Status: DC | PRN

## 2019-09-28 MED ORDER — LACTATED RINGERS IV SOLN
INTRAVENOUS | Status: AC
  Administered 2019-09-28: 17:00:00 via INTRAVENOUS

## 2019-09-28 MED ORDER — PANTOPRAZOLE SODIUM 40 MG IV SOLR
40.0000 mg | INTRAVENOUS | Status: DC
  Administered 2019-09-28 – 2019-09-30 (×3): 40 mg via INTRAVENOUS
  Filled 2019-09-28 (×4): qty 40

## 2019-09-28 MED ORDER — GLUCAGON HCL RDNA (DIAGNOSTIC) 1 MG IJ SOLR
INTRAMUSCULAR | Status: AC
  Filled 2019-09-28: qty 2

## 2019-09-28 MED ORDER — SODIUM CHLORIDE 0.9 % IV SOLN
INTRAVENOUS | Status: AC
  Filled 2019-09-28: qty 100

## 2019-09-28 MED ORDER — PROPOFOL 10 MG/ML IV BOLUS
INTRAVENOUS | Status: AC
  Filled 2019-09-28: qty 20

## 2019-09-28 MED ORDER — HYDROMORPHONE HCL 1 MG/ML IJ SOLN
1.0000 mg | Freq: Once | INTRAMUSCULAR | Status: AC
  Administered 2019-09-28: 1 mg via INTRAVENOUS
  Filled 2019-09-28: qty 1

## 2019-09-28 MED ORDER — ONDANSETRON HCL 4 MG/2ML IJ SOLN
4.0000 mg | Freq: Four times a day (QID) | INTRAMUSCULAR | Status: DC | PRN
  Administered 2019-09-28 – 2019-09-30 (×2): 4 mg via INTRAVENOUS
  Filled 2019-09-28 (×4): qty 2

## 2019-09-28 MED ORDER — ENOXAPARIN SODIUM 40 MG/0.4ML ~~LOC~~ SOLN
40.0000 mg | SUBCUTANEOUS | Status: DC
  Administered 2019-09-28: 40 mg via SUBCUTANEOUS
  Filled 2019-09-28: qty 0.4

## 2019-09-28 MED ORDER — MORPHINE SULFATE (PF) 4 MG/ML IV SOLN
4.0000 mg | Freq: Once | INTRAVENOUS | Status: AC
  Administered 2019-09-28: 4 mg via INTRAVENOUS
  Filled 2019-09-28: qty 1

## 2019-09-28 MED ORDER — HYDROMORPHONE HCL 1 MG/ML IJ SOLN
0.5000 mg | INTRAMUSCULAR | Status: DC | PRN
  Administered 2019-09-28 – 2019-09-29 (×6): 0.5 mg via INTRAVENOUS
  Filled 2019-09-28: qty 0.5
  Filled 2019-09-28 (×3): qty 1
  Filled 2019-09-28 (×3): qty 0.5

## 2019-09-28 MED ORDER — SODIUM CHLORIDE 0.9 % IV SOLN
INTRAVENOUS | Status: AC
  Administered 2019-09-28: 07:00:00 via INTRAVENOUS

## 2019-09-28 MED ORDER — SODIUM CHLORIDE 0.9 % IV SOLN
1.0000 g | INTRAVENOUS | Status: DC
  Administered 2019-09-28 – 2019-10-04 (×7): 1 g via INTRAVENOUS
  Filled 2019-09-28 (×8): qty 10

## 2019-09-28 MED ORDER — LACTATED RINGERS IV SOLN
INTRAVENOUS | Status: DC
  Administered 2019-09-28 – 2019-10-01 (×7): via INTRAVENOUS

## 2019-09-28 NOTE — ED Notes (Signed)
This nurse called to floor- requested receiving nurse look over SBAR and call me back.

## 2019-09-28 NOTE — Progress Notes (Signed)
Patient will need ERCP today. Spoke to lab. Current covid test has not went out and likely not to result until tomorrow. I have call AC and requested to convert test to rapid test per lab recommendations. AC to change order and notify lab of change.   Laureen Ochs. Bernarda Caffey [facility] [phone] 11/19/20209:06 AM

## 2019-09-28 NOTE — H&P (Signed)
TRH H&P    Patient Demographics:    Donetta Isaza, is a 53 y.o. female  MRN: [id number]  DOB - 1966-04-04  Admit Date - 09/28/2019  Referring MD/NP/PA: Dina Rich  Outpatient Primary MD for the patient is Tylene Fantasia, NP  Patient coming from:  home  Chief complaint-  N/v, abd paind   HPI:    Melody Cirrincione  is a 53 y.o. female  w depression, gerd, apparently has had intermittent abdominal pain for the past 1 month.  Over the past 2 days the pain has worsened.  Epgiastric, sharp pain,  Associated with n/v, but no bloody emesis.  Pt denies fever, chills, cough, cp, palp, sob, diarrhea, brbpr, black stool, dysuria, hematuria.   In ED,  T 98.2 P 60 Bp 164/83 pox 99% RA Wt 96.6kg\  CT abd/ pelvis IMPRESSION: Acute edematous pancreatitis without organized collection. The cause is biliary, there is new marked biliary dilatation with 2 small calculi at the level of the ampulla.  Wbc 12.2, HGb 16.1, Plt 212 Na 137, K 3.9, Bun 20, Creatinine 0.84 Ast 330, Alt 468, Alk phos 540, T. Bili 5.8  Lipase 3,832  ED spoke with Dr. Oneida Alar and GI will consult  Pt received dilaudid and zofran in ED.   Pt will be admitted for gallstone pancreatitis.      Review of systems:    In addition to the HPI above,  No Fever-chills, No Headache, No changes with Vision or hearing, No problems swallowing food or Liquids, No Chest pain, Cough or Shortness of Breath,  No Blood in stool or Urine, No dysuria, No new skin rashes or bruises, No new joints pains-aches,  No new weakness, tingling, numbness in any extremity, No recent weight gain or loss, No polyuria, polydypsia or polyphagia, No significant Mental Stressors.  All other systems reviewed and are negative.    Past History of the following :    Past Medical History:  Diagnosis Date  . Depression   . Eating disorder   . GERD (gastroesophageal reflux  disease)   . Melanoma ([place])    left leg      Past Surgical History:  Procedure Laterality Date  . TUBAL LIGATION  1997      Social History:      Social History   Tobacco Use  . Smoking status: Never Smoker  . Smokeless tobacco: Never Used  Substance Use Topics  . Alcohol use: Not Currently    Comment: 2-3 beers after each evening for 6 years       Family History :     Family History  Problem Relation Age of Onset  . Colon cancer Mother 61       colon   . Hypertension Father   . Hyperlipidemia Father   . Liver disease Neg Hx        Home Medications:   Prior to Admission medications   Medication Sig Start Date End Date Taking? Authorizing Provider  ondansetron (ZOFRAN ODT) 4 MG disintegrating tablet Take 1 tablet (4 mg  total) by mouth every 8 (eight) hours as needed for nausea or vomiting. 09/20/19   Annitta Needs, NP  pantoprazole (PROTONIX) 20 MG tablet Take 1 tablet by mouth daily. 09/04/19 09/03/20  [provider]  PARoxetine (PAXIL) 20 MG tablet Take 1 tablet by mouth daily. 09/04/19 09/03/20  [provider]     Allergies:    No Known Allergies   Physical Exam:   Vitals  Blood pressure (!) 164/83, pulse 60, temperature 98.2 F (36.8 C), temperature source Oral, resp. rate 16, height 5' 8"  (1.727 m), weight 96.6 kg, SpO2 99 %.  1.  General: axoxo3  2. Psychiatric: euthymic  3. Neurologic: nonfocal  4. HEENMT:  Icteric, pupils 1.49m symmetric, direct , consensual , near intact Neck : no jvd  5. Respiratory : CTAB  6. Cardiovascular : rrr s1, s2,   7. Gastrointestinal:  ABd: soft, nt, nd, +bs  8. Skin:  Ext: no c/c/e,  No rash  9.Musculoskeletal:  Good ROM    Data Review:    CBC Recent Labs  Lab 09/28/19 0253  WBC 12.2*  HGB 16.1*  HCT 50.0*  PLT 212  MCV 92.6  MCH 29.8  MCHC 32.2  RDW 12.4  LYMPHSABS 0.5*  MONOABS 0.5  EOSABS 0.0  BASOSABS 0.0    ------------------------------------------------------------------------------------------------------------------  Results for orders placed or performed during the hospital encounter of 09/28/19 (from the past 48 hour(s))  CBC with Differential     Status: Abnormal   Collection Time: 09/28/19  2:53 AM  Result Value Ref Range   WBC 12.2 (H) 4.0 - 10.5 K/uL   RBC 5.40 (H) 3.87 - 5.11 MIL/uL   Hemoglobin 16.1 (H) 12.0 - 15.0 g/dL   HCT 50.0 (H) 36.0 - 46.0 %   MCV 92.6 80.0 - 100.0 fL   MCH 29.8 26.0 - 34.0 pg   MCHC 32.2 30.0 - 36.0 g/dL   RDW 12.4 11.5 - 15.5 %   Platelets 212 150 - 400 K/uL   nRBC 0.0 0.0 - 0.2 %   Neutrophils Relative % 92 %   Neutro Abs 11.1 (H) 1.7 - 7.7 K/uL   Lymphocytes Relative 4 %   Lymphs Abs 0.5 (L) 0.7 - 4.0 K/uL   Monocytes Relative 4 %   Monocytes Absolute 0.5 0.1 - 1.0 K/uL   Eosinophils Relative 0 %   Eosinophils Absolute 0.0 0.0 - 0.5 K/uL   Basophils Relative 0 %   Basophils Absolute 0.0 0.0 - 0.1 K/uL   Immature Granulocytes 0 %   Abs Immature Granulocytes 0.05 0.00 - 0.07 K/uL    Comment: Performed at [facility] [address] [place] [address] Comprehensive metabolic panel     Status: Abnormal   Collection Time: 09/28/19  2:53 AM  Result Value Ref Range   Sodium 137 135 - 145 mmol/L   Potassium 3.9 3.5 - 5.1 mmol/L   Chloride 100 98 - 111 mmol/L   CO2 26 22 - 32 mmol/L   Glucose, Bld 175 (H) 70 - 99 mg/dL   BUN 20 6 - 20 mg/dL   Creatinine, Ser 0.84 0.44 - 1.00 mg/dL   Calcium 9.1 8.9 - 10.3 mg/dL   Total Protein 8.4 (H) 6.5 - 8.1 g/dL   Albumin 4.2 3.5 - 5.0 g/dL   AST 330 (H) 15 - 41 U/L    Comment: RESULTS CONFIRMED BY MANUAL DILUTION   ALT 468 (H) 0 - 44 U/L   Alkaline Phosphatase 540 (  H) 38 - 126 U/L   Total Bilirubin 5.8 (H) 0.3 - 1.2 mg/dL   GFR calc non Af Amer >60 >60 mL/min   GFR calc Af Amer >60 >60 mL/min   Anion gap 11 5 - 15    Comment: Performed at [facility], [address] [place], [place]  [address]  Lipase, blood     Status: Abnormal   Collection Time: 09/28/19  2:53 AM  Result Value Ref Range   Lipase 3,832 (H) 11 - 51 U/L    Comment: RESULTS CONFIRMED BY MANUAL DILUTION Performed at [facility], [address], [place], [place] [address]   Ethanol     Status: None   Collection Time: 09/28/19  2:53 AM  Result Value Ref Range   Alcohol, Ethyl (B) <10 <10 mg/dL    Comment: (NOTE) Lowest detectable limit for serum alcohol is 10 mg/dL. For medical purposes only. Performed at [facility], [address] [place], [place] [address]     Chemistries  Recent Labs  Lab 09/28/19 0253  NA 137  K 3.9  CL 100  CO2 26  GLUCOSE 175*  BUN 20  CREATININE 0.84  CALCIUM 9.1  AST 330*  ALT 468*  ALKPHOS 540*  BILITOT 5.8*   ------------------------------------------------------------------------------------------------------------------  ------------------------------------------------------------------------------------------------------------------ GFR: Estimated Creatinine Clearance: 96.3 mL/min (by C-G formula based on SCr of 0.84 mg/dL). Liver Function Tests: Recent Labs  Lab 09/28/19 0253  AST 330*  ALT 468*  ALKPHOS 540*  BILITOT 5.8*  PROT 8.4*  ALBUMIN 4.2   Recent Labs  Lab 09/28/19 0253  LIPASE 3,832*   No results for input(s): AMMONIA in the last 168 hours. Coagulation Profile: No results for input(s): INR, PROTIME in the last 168 hours. Cardiac Enzymes: No results for input(s): CKTOTAL, CKMB, CKMBINDEX, TROPONINI in the last 168 hours. BNP (last 3 results) No results for input(s): PROBNP in the last 8760 hours. HbA1C: No results for input(s): HGBA1C in the last 72 hours. CBG: No results for input(s): GLUCAP in the last 168 hours. Lipid Profile: No results for input(s): CHOL, HDL, LDLCALC, TRIG, CHOLHDL, LDLDIRECT in the last 72 hours. Thyroid Function Tests: No results for input(s): TSH, T4TOTAL, FREET4, T3FREE, THYROIDAB in the last 72  hours. Anemia Panel: No results for input(s): VITAMINB12, FOLATE, FERRITIN, TIBC, IRON, RETICCTPCT in the last 72 hours.  --------------------------------------------------------------------------------------------------------------- Urine analysis: No results found for: COLORURINE, APPEARANCEUR, LABSPEC, PHURINE, GLUCOSEU, HGBUR, BILIRUBINUR, KETONESUR, PROTEINUR, UROBILINOGEN, NITRITE, LEUKOCYTESUR    Imaging Results:    Ct Abdomen Pelvis W Contrast  Result Date: 09/28/2019 CLINICAL DATA:  Acute generalized abdominal pain EXAM: CT ABDOMEN AND PELVIS WITH CONTRAST TECHNIQUE: Multidetector CT imaging of the abdomen and pelvis was performed using the standard protocol following bolus administration of intravenous contrast. CONTRAST:  159m OMNIPAQUE IOHEXOL 300 MG/ML  SOLN COMPARISON:  Five days ago FINDINGS: Lower chest: Subpleural nodule over the right diaphragm most consistent with lymph node. Hepatobiliary: New prominent bile duct dilatation that is both intra and extrahepatic. Two calcified stones are seen at the level of the distal CBD just before the ampulla, measuring up to 3 mm.Small cystic density in the right liver. Fluid around the gallbladder is likely secondary. No wall thickening typical of acute cholecystitis. Pancreas: New generalized pancreatic expansion and peripancreatic edema without organized collection or necrosis. Spleen: Unremarkable. Adrenals/Urinary Tract: Negative adrenals. No hydronephrosis or stone. Unremarkable bladder. Stomach/Bowel:  No obstruction. Moderate left colonic diverticulosis Vascular/Lymphatic: No acute vascular abnormality. No mass or adenopathy. Reproductive:No pathologic findings.  Other: No ascites or pneumoperitoneum. Musculoskeletal: No acute abnormalities.  L5-S1 disc degeneration IMPRESSION: Acute edematous pancreatitis without organized collection. The cause is biliary, there is new marked biliary dilatation with 2 small calculi at the level of the  ampulla. Electronically Signed   By: Monte Fantasia M.D.   On: 09/28/2019 04:40   nsr at 70, nl axis, nl int, no st-t changes c/w ischemia   Assessment & Plan:    Principal Problem:   Pancreatitis  Pancreatitis, Gallstone NPO Hydrate with ns at 150 mL per hour x 12 hours Dilaudid 0.45m iv q4h prn  zofran 4109miv q6h prn  Benadryl 2586mv q6h prn itching Gastroenterology consulted by ED, appreciate input Probably needs ERCP  Depression Hold Paxil  Gerd Protonix 28m53m qday  DVT Prophylaxis-   Lovenox - SCDs  AM Labs Ordered, also please review Full Orders  Family Communication: Admission, patients condition and plan of care including tests being ordered have been discussed with the patient  who indicate understanding and agree with the plan and Code Status.  Code Status:  FULL CODE per patient  Admission status:   Inpatient: Based on patients clinical presentation and evaluation of above clinical data, I have made determination that patient meets Inpatient criteria at this time.   Pt has high risk of clinical deterioration,  Pt will require iv hydration as well as iv pain medication, for gall stone pancreatitis.  Pt will require > 2 nites stay.   Time spent in minutes : 55 minutes   JameJani Gravel on 09/28/2019 at 5:52 AM

## 2019-09-28 NOTE — Telephone Encounter (Signed)
Outpatient MRCP had been planned for today to assess for occult stones, further evaluate pancreas, due to worsening pain. CT last week unremarkable. Now inpatient with gallstone pancreatitis. MRCP cancelled.

## 2019-09-28 NOTE — ED Triage Notes (Signed)
Pt c/o epigastric pain with n/v x one day

## 2019-09-28 NOTE — Anesthesia Preprocedure Evaluation (Addendum)
Anesthesia Evaluation  Patient identified by MRN, date of birth, ID band Patient awake    Reviewed: Allergy & Precautions, NPO status , Patient's Chart, lab work & pertinent test results  Airway Mallampati: II  TM Distance: >3 FB Neck ROM: Full    Dental  (+) Dental Advisory Given [place], left upper broken tooth:   Pulmonary neg pulmonary ROS,    Pulmonary exam normal breath sounds clear to auscultation       Cardiovascular Exercise Tolerance: Poor Normal cardiovascular exam Rhythm:Regular Rate:Normal     Neuro/Psych PSYCHIATRIC DISORDERS Depression    GI/Hepatic GERD  Medicated,Gallstone pancreatitis, high LFTs   Endo/Other  negative endocrine ROS  Renal/GU negative Renal ROS     Musculoskeletal negative musculoskeletal ROS (+)   Abdominal   Peds  Hematology negative hematology ROS (+)   Anesthesia Other Findings   Reproductive/Obstetrics negative OB ROS                         Anesthesia Physical Anesthesia Plan  ASA: III  Anesthesia Plan: General   Post-op Pain Management:    Induction: Intravenous  PONV Risk Score and Plan: 3 and Ondansetron, Dexamethasone and Midazolam  Airway Management Planned: Oral ETT  Additional Equipment:   Intra-op Plan:   Post-operative Plan: Extubation in OR  Informed Consent: I have reviewed the patients History and Physical, chart, labs and discussed the procedure including the risks, benefits and alternatives for the proposed anesthesia with the patient or authorized representative who has indicated his/her understanding and acceptance.     Dental advisory given  Plan Discussed with: CRNA  Anesthesia Plan Comments:       Anesthesia Quick Evaluation

## 2019-09-28 NOTE — ED Provider Notes (Signed)
[facility] EMERGENCY DEPARTMENT Provider Note   CSN: [id number] Arrival date & time: 09/28/19  0157     History   Chief Complaint Chief Complaint  Patient presents with  . Abdominal Pain    HPI Donna Vega is a 53 y.o. female.     HPI  This is a 53 year old female with a history of reflux who presents with abdominal pain.  Patient reports that she has had waxing and waning abdominal pain over the last month.  Over the last 2 to 3 days it has worsened.  She reports epigastric pain that is sharp and nonradiating.  It is worse after eating, at night, and when laying flat.  Patient rates her pain at 10 out of 10.  She takes Protonix which she states sometimes helps.  She reports over the last 2 to 3 days she has had multiple episodes of nonbilious, nonbloody emesis.  She denies constipation.  She does report loose stools.  She saw her GI doctor and was told her liver function tests were high.  She denies any NSAID use.  She does report history of alcohol use daily but states that she stopped approximately 1 month ago.  I have reviewed the patient's chart.  Patient did see [facility] gastroenterology.  At that time she had lab work that showed some elevated LFTs.  She had a CT scan on November 11 which was largely unremarkable.  She is scheduled for an MRCP on Friday.  Past Medical History:  Diagnosis Date  . Depression   . Eating disorder   . GERD (gastroesophageal reflux disease)   . Melanoma ([place])    left leg    Patient Active Problem List   Diagnosis Date Noted  . Pancreatitis 09/28/2019  . Abdominal pain, epigastric 09/20/2019  . Eating disorder 10/03/2012  . Stress 10/03/2012  . Screening for breast cancer 10/03/2012    Past Surgical History:  Procedure Laterality Date  . TUBAL LIGATION  1997     OB History   No obstetric history on file.      Home Medications    Prior to Admission medications   Medication Sig Start Date End Date Taking? Authorizing  Provider  ondansetron (ZOFRAN ODT) 4 MG disintegrating tablet Take 1 tablet (4 mg total) by mouth every 8 (eight) hours as needed for nausea or vomiting. 09/20/19   Annitta Needs, NP  pantoprazole (PROTONIX) 20 MG tablet Take 1 tablet by mouth daily. 09/04/19 09/03/20  [provider]  PARoxetine (PAXIL) 20 MG tablet Take 1 tablet by mouth daily. 09/04/19 09/03/20  [provider]    Family History Family History  Problem Relation Age of Onset  . Colon cancer Mother 101       colon   . Hypertension Father   . Hyperlipidemia Father   . Liver disease Neg Hx     Social History Social History   Tobacco Use  . Smoking status: Never Smoker  . Smokeless tobacco: Never Used  Substance Use Topics  . Alcohol use: Not Currently    Comment: 2-3 beers after each evening for 6 years  . Drug use: No     Allergies   Patient has no known allergies.   Review of Systems Review of Systems  Constitutional: Negative for fever.  Respiratory: Negative for shortness of breath.   Cardiovascular: Negative for chest pain.  Gastrointestinal: Positive for abdominal pain, diarrhea, nausea and vomiting.  Genitourinary: Negative for dysuria.  Neurological: Negative for headaches.  All other systems reviewed and are negative.    Physical Exam Updated Vital Signs BP (!) 164/83 (BP Location: Left Arm)   Pulse 60   Temp 98.2 F (36.8 C) (Oral)   Resp 16   Ht 1.727 m (5' 8" )   Wt 96.6 kg   SpO2 99%   BMI 32.39 kg/m   Physical Exam Vitals signs and nursing note reviewed.  Constitutional:      Appearance: She is well-developed. She is not ill-appearing.  HENT:     Head: Normocephalic and atraumatic.     Mouth/Throat:     Mouth: Mucous membranes are moist.  Eyes:     Pupils: Pupils are equal, round, and reactive to light.  Neck:     Musculoskeletal: Neck supple.  Cardiovascular:     Rate and Rhythm: Normal rate and regular rhythm.     Heart sounds: Normal heart sounds.   Pulmonary:     Effort: Pulmonary effort is normal. No respiratory distress.     Breath sounds: No wheezing.  Abdominal:     General: Abdomen is flat. Bowel sounds are normal.     Palpations: Abdomen is soft.     Tenderness: There is abdominal tenderness in the epigastric area. There is no guarding or rebound. Negative signs include Murphy's sign.  Musculoskeletal:     Right lower leg: No edema.     Left lower leg: No edema.  Skin:    General: Skin is warm and dry.  Neurological:     Mental Status: She is alert and oriented to person, place, and time.  Psychiatric:        Mood and Affect: Mood normal.      ED Treatments / Results  Labs (all labs ordered are listed, but only abnormal results are displayed) Labs Reviewed  CBC WITH DIFFERENTIAL/PLATELET - Abnormal; Notable for the following components:      Result Value   WBC 12.2 (*)    RBC 5.40 (*)    Hemoglobin 16.1 (*)    HCT 50.0 (*)    Neutro Abs 11.1 (*)    Lymphs Abs 0.5 (*)    All other components within normal limits  COMPREHENSIVE METABOLIC PANEL - Abnormal; Notable for the following components:   Glucose, Bld 175 (*)    Total Protein 8.4 (*)    AST 330 (*)    ALT 468 (*)    Alkaline Phosphatase 540 (*)    Total Bilirubin 5.8 (*)    All other components within normal limits  LIPASE, BLOOD - Abnormal; Notable for the following components:   Lipase 3,832 (*)    All other components within normal limits  SARS CORONAVIRUS 2 (TAT 6-24 HRS)  ETHANOL  LACTATE DEHYDROGENASE  LIPID PANEL    EKG EKG Interpretation  Date/Time:  Thursday September 28 2019 02:35:01 EST Ventricular Rate:  71 PR Interval:    QRS Duration: 91 QT Interval:  424 QTC Calculation: 461 R Axis:   65 Text Interpretation: Sinus rhythm Borderline T abnormalities, anterior leads Confirmed by Thayer Jew [phone]) on 09/28/2019 4:06:07 AM   Radiology Ct Abdomen Pelvis W Contrast  Result Date: 09/28/2019 CLINICAL DATA:  Acute  generalized abdominal pain EXAM: CT ABDOMEN AND PELVIS WITH CONTRAST TECHNIQUE: Multidetector CT imaging of the abdomen and pelvis was performed using the standard protocol following bolus administration of intravenous contrast. CONTRAST:  140m OMNIPAQUE IOHEXOL 300 MG/ML  SOLN COMPARISON:  Five days ago FINDINGS: Lower chest: Subpleural nodule over the right  diaphragm most consistent with lymph node. Hepatobiliary: New prominent bile duct dilatation that is both intra and extrahepatic. Two calcified stones are seen at the level of the distal CBD just before the ampulla, measuring up to 3 mm.Small cystic density in the right liver. Fluid around the gallbladder is likely secondary. No wall thickening typical of acute cholecystitis. Pancreas: New generalized pancreatic expansion and peripancreatic edema without organized collection or necrosis. Spleen: Unremarkable. Adrenals/Urinary Tract: Negative adrenals. No hydronephrosis or stone. Unremarkable bladder. Stomach/Bowel:  No obstruction. Moderate left colonic diverticulosis Vascular/Lymphatic: No acute vascular abnormality. No mass or adenopathy. Reproductive:No pathologic findings. Other: No ascites or pneumoperitoneum. Musculoskeletal: No acute abnormalities.  L5-S1 disc degeneration IMPRESSION: Acute edematous pancreatitis without organized collection. The cause is biliary, there is new marked biliary dilatation with 2 small calculi at the level of the ampulla. Electronically Signed   By: Monte Fantasia M.D.   On: 09/28/2019 04:40    Procedures Procedures (including critical care time)  Medications Ordered in ED Medications  enoxaparin (LOVENOX) injection 40 mg (has no administration in time range)  0.9 %  sodium chloride infusion (has no administration in time range)  acetaminophen (TYLENOL) tablet 650 mg (has no administration in time range)    Or  acetaminophen (TYLENOL) suppository 650 mg (has no administration in time range)  HYDROmorphone  (DILAUDID) injection 0.5 mg (has no administration in time range)  ondansetron (ZOFRAN) injection 4 mg (has no administration in time range)  diphenhydrAMINE (BENADRYL) injection 25 mg (has no administration in time range)  pantoprazole (PROTONIX) injection 40 mg (has no administration in time range)  morphine 4 MG/ML injection 4 mg (4 mg Intravenous Given 09/28/19 0253)  ondansetron (ZOFRAN) injection 4 mg (4 mg Intravenous Given 09/28/19 0253)  sodium chloride 0.9 % bolus 1,000 mL (0 mLs Intravenous Stopped 09/28/19 0518)  iohexol (OMNIPAQUE) 300 MG/ML solution 100 mL (100 mLs Intravenous Contrast Given 09/28/19 0424)  HYDROmorphone (DILAUDID) injection 1 mg (1 mg Intravenous Given 09/28/19 0518)     Initial Impression / Assessment and Plan / ED Course  I have reviewed the triage vital signs and the nursing notes.  Pertinent labs & imaging results that were available during my care of the patient were reviewed by me and considered in my medical decision making (see chart for details).  Clinical Course as of Sep 28 603  Thu Sep 28, 2019  0500 Spoke with Dr. Oneida Alar.  Patient will be evaluated by GI later today.  She is currently n.p.o.   Covid testing is pending.   [CH]    Clinical Course User Index [CH] Horton, Barbette Hair, MD       Patient presents with epigastric pain.  She is overall nontoxic-appearing vital signs are reassuring.  She is tender in the abdomen without signs of peritonitis.  Lab work is notable for leukocytosis to 12.2.  She has markedly elevated LFTs including AST to 330, ALT 468, alk phos 540, total bili 5.8.  Lipase is elevated to greater than 3000.  This is highly suspicious for gallstone pancreatitis and possible choledocholithiasis.  Unable to get an ultrasound at this time.  Will obtain CT.  CT shows 2 stones at the ampulla.  Spoke with GI.  They will evaluate patient first thing in the morning.  Spoke with Dr. Maudie Mercury, hospitalist for admission.  Final Clinical  Impressions(s) / ED Diagnoses   Final diagnoses:  Acute gallstone pancreatitis    ED Discharge Orders    None  Merryl Hacker, MD 09/28/19 [phone]

## 2019-09-28 NOTE — Consult Note (Addendum)
Referring Provider: Rodena Goldmann, DO Primary Care Physician:  Tylene Fantasia, NP Primary Gastroenterologist:  Garfield Cornea, MD   Reason for Consultation:  Gallstone pancreatitis  HPI: Donna Vega is a 53 y.o. female with history of GERD, depression, melanoma presenting to the hospital with 2-day history of worsening upper abdominal pain.  She has a 1 month history of intermittent abdominal pain which was being evaluated recently by our practice.  Over the past 2 to 3 days her symptoms have worsened, 10 out of 10 upper abdominal pain with radiation into the back, associated with nausea and vomiting.  Over 12 pound weight loss in the past month.  With complaints of pruritus.  No jaundice.  Has felt tired and fatigued.  On November 11, her total bilirubin was 1.0, alkaline phosphatase 353, AST 107, ALT 259. Multiple serologies obtained including viral markers and autoimmune.  Everything unremarkable except for positive ANA (low titer) in a cytoplasmic pattern.  Initial CT abdomen and pelvis with contrast on November 12 was normal outside of hepatic cysts.  There was no biliary dilatation, gallbladder appeared normal.  She was scheduled for MRCP tomorrow to exclude CBD stones, occult malignancy.  Due to progressive, unrelenting pain she went to the ED early this morning.  White blood cell count 12,000, total bilirubin up to 5.8, alk phos 540, AST 380, ALT 468, lipase 3832.  Covid test negative.  Repeat CT abdomen and pelvis today showed new prominent bile duct dilatation both intra and extrahepatic.  2 calcified stones seen at the level of the distal CBD just before the ampulla, measuring up to 3 mm.  No wall thickening of the gallbladder. New generalized pancreatic expansion and peripancreatic edema consistent with acute edematous pancreatitis.  She received a bolus of normal saline, 1 L, now 150 cc/h.  Rocephin has been ordered but not given.  Lovenox 40 mg subcu given at 6:32 AM today.     Prior to Admission medications   Medication Sig Start Date End Date Taking? Authorizing Provider  ondansetron (ZOFRAN ODT) 4 MG disintegrating tablet Take 1 tablet (4 mg total) by mouth every 8 (eight) hours as needed for nausea or vomiting. 09/20/19   Annitta Needs, NP  pantoprazole (PROTONIX) 20 MG tablet Take 1 tablet by mouth daily. 09/04/19 09/03/20  [provider]  PARoxetine (PAXIL) 20 MG tablet Take 1 tablet by mouth daily. 09/04/19 09/03/20  [provider]    Current Facility-Administered Medications  Medication Dose Route Frequency Provider Last Rate Last Dose  . 0.9 %  sodium chloride infusion   Intravenous Continuous Jani Gravel, MD 150 mL/hr at 09/28/19 5993    . acetaminophen (TYLENOL) tablet 650 mg  650 mg Oral Q6H PRN Jani Gravel, MD       Or  . acetaminophen (TYLENOL) suppository 650 mg  650 mg Rectal Q6H PRN Jani Gravel, MD      . cefTRIAXone (ROCEPHIN) 1 g in sodium chloride 0.9 % 100 mL IVPB  1 g Intravenous Q24H Rehman, Najeeb U, MD      . diphenhydrAMINE (BENADRYL) injection 25 mg  25 mg Intravenous Q6H PRN Jani Gravel, MD      . HYDROmorphone (DILAUDID) injection 0.5 mg  0.5 mg Intravenous Q4H PRN Jani Gravel, MD   0.5 mg at 09/28/19 0953  . ondansetron (ZOFRAN) injection 4 mg  4 mg Intravenous Q6H PRN Jani Gravel, MD      . pantoprazole (PROTONIX) injection 40 mg  40 mg Intravenous  Q24H Jani Gravel, MD   40 mg at 09/28/19 9767   Current Outpatient Medications  Medication Sig Dispense Refill  . ondansetron (ZOFRAN ODT) 4 MG disintegrating tablet Take 1 tablet (4 mg total) by mouth every 8 (eight) hours as needed for nausea or vomiting. 60 tablet 1  . pantoprazole (PROTONIX) 20 MG tablet Take 1 tablet by mouth daily.    Marland Kitchen PARoxetine (PAXIL) 20 MG tablet Take 1 tablet by mouth daily.      Allergies as of 09/28/2019  . (No Known Allergies)    Past Medical History:  Diagnosis Date  . Depression   . Eating disorder   . GERD (gastroesophageal reflux  disease)   . Melanoma ([place])    left leg    Past Surgical History:  Procedure Laterality Date  . TUBAL LIGATION  1997    Family History  Problem Relation Age of Onset  . Colon cancer Mother 52       colon   . Hypertension Father   . Hyperlipidemia Father   . Liver disease Neg Hx     Social History   Socioeconomic History  . Marital status: Married    Spouse name: Not on file  . Number of children: Not on file  . Years of education: Not on file  . Highest education level: Not on file  Occupational History  . Not on file  Social Needs  . Financial resource strain: Not on file  . Food insecurity    Worry: Not on file    Inability: Not on file  . Transportation needs    Medical: Not on file    Non-medical: Not on file  Tobacco Use  . Smoking status: Never Smoker  . Smokeless tobacco: Never Used  Substance and Sexual Activity  . Alcohol use: Not Currently    Comment: 2-3 beers after each evening for 6 years  . Drug use: No  . Sexual activity: Yes    Partners: Male  Lifestyle  . Physical activity    Days per week: Not on file    Minutes per session: Not on file  . Stress: Not on file  Relationships  . Social Herbalist on phone: Not on file    Gets together: Not on file    Attends religious service: Not on file    Active member of club or organization: Not on file    Attends meetings of clubs or organizations: Not on file    Relationship status: Not on file  . Intimate partner violence    Fear of current or ex partner: Not on file    Emotionally abused: Not on file    Physically abused: Not on file    Forced sexual activity: Not on file  Other Topics Concern  . Not on file  Social History Narrative  . Not on file     ROS:  General: Negative for fever, chills.  Positive for weight loss, fatigue, weakness. Eyes: Negative for vision changes.  ENT: Negative for hoarseness, difficulty swallowing , nasal congestion. CV: Negative for chest pain,  angina, palpitations, dyspnea on exertion, peripheral edema.  Respiratory: Negative for dyspnea at rest, dyspnea on exertion, cough, sputum, wheezing.  GI: See history of present illness. GU:  Negative for dysuria, hematuria, urinary incontinence, urinary frequency, nocturnal urination.  MS: Negative for joint pain, low back pain.  Derm: Negative for rash or itching.  Neuro: Negative for weakness, abnormal sensation, seizure, frequent headaches, memory loss,  confusion.  Psych: Negative for anxiety, depression, suicidal ideation, hallucinations.  Endo: Negative for unusual weight change.  Heme: Negative for bruising or bleeding. Allergy: Negative for rash or hives.       Physical Examination: Vital signs in last 24 hours: Temp:  [98.2 F (36.8 C)] 98.2 F (36.8 C) (11/19 0221) Pulse Rate:  [60-88] 88 (11/19 1000) Resp:  [13-21] 16 (11/19 0900) BP: (128-164)/(75-95) 128/75 (11/19 1000) SpO2:  [95 %-100 %] 97 % (11/19 1000) Weight:  [96.6 kg] 96.6 kg (11/19 0222)    General: Well-nourished, well-developed in no acute distress.  Does not appear toxic but definitely uncomfortable. Head: Normocephalic, atraumatic.   Eyes: Conjunctiva pink, no icterus. Mouth: Oropharyngeal mucosa moist and pink , no lesions erythema or exudate. Neck: Supple without thyromegaly, masses, or lymphadenopathy.  Lungs: Clear to auscultation bilaterally.  Heart: Regular rate and rhythm, no murmurs rubs or gallops.  Abdomen: Bowel sounds are normal, no hepatosplenomegaly or masses, no abdominal bruits or    hernia , no rebound or guarding.  Moderate upper abdominal tenderness particularly epigastrium.  Abdomen is soft. Rectal: Not performed Extremities: No lower extremity edema, clubbing, deformity.  Neuro: Alert and oriented x 4 , grossly normal neurologically.  Skin: Warm and dry, no rash or jaundice.   Psych: Alert and cooperative, normal mood and affect.        Intake/Output from previous day: No  intake/output data recorded. Intake/Output this shift: No intake/output data recorded.  Lab Results: CBC Recent Labs    09/28/19 0253  WBC 12.2*  HGB 16.1*  HCT 50.0*  MCV 92.6  PLT 212   BMET Recent Labs    09/28/19 0253  NA 137  K 3.9  CL 100  CO2 26  GLUCOSE 175*  BUN 20  CREATININE 0.84  CALCIUM 9.1   LFT Recent Labs    09/28/19 0253  BILITOT 5.8*  ALKPHOS 540*  AST 330*  ALT 468*  PROT 8.4*  ALBUMIN 4.2    Lipase Recent Labs    09/28/19 0253  LIPASE 3,832*    PT/INR No results for input(s): LABPROT, INR in the last 72 hours.    Imaging Studies: Ct Abdomen Pelvis W Contrast  Result Date: 09/28/2019 CLINICAL DATA:  Acute generalized abdominal pain EXAM: CT ABDOMEN AND PELVIS WITH CONTRAST TECHNIQUE: Multidetector CT imaging of the abdomen and pelvis was performed using the standard protocol following bolus administration of intravenous contrast. CONTRAST:  162m OMNIPAQUE IOHEXOL 300 MG/ML  SOLN COMPARISON:  Five days ago FINDINGS: Lower chest: Subpleural nodule over the right diaphragm most consistent with lymph node. Hepatobiliary: New prominent bile duct dilatation that is both intra and extrahepatic. Two calcified stones are seen at the level of the distal CBD just before the ampulla, measuring up to 3 mm.Small cystic density in the right liver. Fluid around the gallbladder is likely secondary. No wall thickening typical of acute cholecystitis. Pancreas: New generalized pancreatic expansion and peripancreatic edema without organized collection or necrosis. Spleen: Unremarkable. Adrenals/Urinary Tract: Negative adrenals. No hydronephrosis or stone. Unremarkable bladder. Stomach/Bowel:  No obstruction. Moderate left colonic diverticulosis Vascular/Lymphatic: No acute vascular abnormality. No mass or adenopathy. Reproductive:No pathologic findings. Other: No ascites or pneumoperitoneum. Musculoskeletal: No acute abnormalities.  L5-S1 disc degeneration  IMPRESSION: Acute edematous pancreatitis without organized collection. The cause is biliary, there is new marked biliary dilatation with 2 small calculi at the level of the ampulla. Electronically Signed   By: JMonte FantasiaM.D.   On: 09/28/2019 04:40  Ct Abdomen Pelvis W Contrast  Result Date: 09/21/2019 CLINICAL DATA:  Epigastric pain. Discoloration of the stool. Nausea and vomiting. Itching all over for 1 month. EXAM: CT ABDOMEN AND PELVIS WITH CONTRAST TECHNIQUE: Multidetector CT imaging of the abdomen and pelvis was performed using the standard protocol following bolus administration of intravenous contrast. CONTRAST:  159m OMNIPAQUE IOHEXOL 300 MG/ML  SOLN COMPARISON:  None. FINDINGS: Lower chest: Clear lung bases.  Heart normal in size. Hepatobiliary: Liver normal in size and attenuation. Subcentimeter low-density lesion, segment cysts, consistent with a cyst. No other liver masses or lesions. Normal gallbladder. No bile duct dilation. Pancreas: Unremarkable. No pancreatic ductal dilatation or surrounding inflammatory changes. Spleen: Normal in size without focal abnormality. Adrenals/Urinary Tract: Adrenal glands are unremarkable. Kidneys are normal, without renal calculi, focal lesion, or hydronephrosis. Bladder is unremarkable. Stomach/Bowel: Normal stomach. Small bowel is normal in caliber. No wall thickening or inflammation. Colon is normal in caliber. There are left colon diverticula without diverticulitis. No colonic wall thickening or other inflammatory process. Normal appendix visualized. Vascular/Lymphatic: Aortic atherosclerosis. No aneurysm. No enlarged lymph nodes. Reproductive: Uterus and bilateral adnexa are unremarkable. Other: No abdominal wall hernia or abnormality. No abdominopelvic ascites. Musculoskeletal: No fracture or acute finding. No osteoblastic or osteolytic lesions. IMPRESSION: 1. No acute findings. No findings to account for the patient's symptoms. 2. Subcentimeter  low-density liver lesion consistent with a cyst. Aortic atherosclerosis. Left colon diverticula without diverticulitis. Electronically Signed   By: DLajean ManesM.D.   On: 09/21/2019 14:38  [4 week]   Impression: Pleasant 53year old female with 1 month history of intermittent upper abdominal pain associated with vomiting.  Last week noted to have abnormal LFTs.  CT was unremarkable.  MRCP was scheduled for tomorrow to rule out CBD stone or occult malignancy.  Over the past 3 days she has had increase in her abdominal pain becoming unbearable with persistent vomiting which prompted ED evaluation.  Noted to have acute increase in LFTs, lipase over 3000, CT consistent with biliary pancreatitis with 2 calcified stones in the distal common bile duct at the level of the ampulla.  Plan: 1. ERCP planned, Dr. RLaural Goldenwill perform procedure.   I have discussed the risks, alternatives, benefits with regards to but not limited to the risk of reaction to medication, bleeding, infection, perforation and the patient is agreeable to proceed. Written consent to be obtained. 2. Aggressive fluid resuscitation to reduce morbidity/mortality of pancreatitis.  She received 1 L bolus on arrival.  Increase normal saline to 400 cc/h, weight based.  Recheck CBC/met-7. Discussed with Dr. RGala Romney 3. Patient received Lovenox at 6 AM.  Has been discontinued in preparation for ERCP this afternoon.  We would like to thank you for the opportunity to participate in the care of [place]  LLaureen Ochs LBernarda CaffeyROconomowoc [facility] [phone]/19/202011:07 AM     LOS: 0 days

## 2019-09-28 NOTE — Progress Notes (Signed)
Per HPI:  Donna Vega  is a 53 y.o. female  w depression, gerd, apparently has had intermittent abdominal pain for the past 1 month.  Over the past 2 days the pain has worsened.  Epgiastric, sharp pain,  Associated with n/v, but no bloody emesis.  Pt denies fever, chills, cough, cp, palp, sob, diarrhea, brbpr, black stool, dysuria, hematuria.   Patient was noted to have biliary pancreatitis noted on CT abdomen and pelvis along with lipase elevation of 3832.  She has been seen by GI today with plans to keep n.p.o. with aggressive fluid resuscitation.  She is going to be scheduled for ERCP later today.  We will plan to recheck labs in a.m.  Discussed with GI Dr. Laural Golden.  Total care time: 20 minutes.

## 2019-09-28 NOTE — ED Notes (Signed)
Pt given ice chips

## 2019-09-29 ENCOUNTER — Inpatient Hospital Stay (HOSPITAL_COMMUNITY): Payer: 59

## 2019-09-29 ENCOUNTER — Inpatient Hospital Stay (HOSPITAL_COMMUNITY): Payer: 59 | Admitting: Anesthesiology

## 2019-09-29 ENCOUNTER — Encounter (HOSPITAL_COMMUNITY)
Admission: EM | Disposition: A | Discharge status: Home / Self Care | Payer: Self-pay | Source: Home / Self Care | Attending: Internal Medicine

## 2019-09-29 ENCOUNTER — Encounter (HOSPITAL_COMMUNITY): Payer: Self-pay | Admitting: *Deleted

## 2019-09-29 ENCOUNTER — Ambulatory Visit (HOSPITAL_COMMUNITY): Payer: 59

## 2019-09-29 DIAGNOSIS — K859 Acute pancreatitis without necrosis or infection, unspecified: Secondary | ICD-10-CM

## 2019-09-29 DIAGNOSIS — K838 Other specified diseases of biliary tract: Secondary | ICD-10-CM

## 2019-09-29 DIAGNOSIS — K851 Biliary acute pancreatitis without necrosis or infection: Secondary | ICD-10-CM

## 2019-09-29 HISTORY — PX: ERCP: SHX5425

## 2019-09-29 LAB — LIPASE, BLOOD: Lipase: 1159 U/L — ABNORMAL HIGH (ref 11–51)

## 2019-09-29 LAB — COMPREHENSIVE METABOLIC PANEL
ALT: 257 U/L — ABNORMAL HIGH (ref 0–44)
AST: 106 U/L — ABNORMAL HIGH (ref 15–41)
Albumin: 2.9 g/dL — ABNORMAL LOW (ref 3.5–5.0)
Alkaline Phosphatase: 371 U/L — ABNORMAL HIGH (ref 38–126)
Anion gap: 10 (ref 5–15)
BUN: 19 mg/dL (ref 6–20)
CO2: 23 mmol/L (ref 22–32)
Calcium: 7.9 mg/dL — ABNORMAL LOW (ref 8.9–10.3)
Chloride: 105 mmol/L (ref 98–111)
Creatinine, Ser: 0.63 mg/dL (ref 0.44–1.00)
GFR calc Af Amer: >60 mL/min (ref >60)
GFR calc non Af Amer: >60 mL/min (ref >60)
Glucose, Bld: 108 mg/dL — ABNORMAL HIGH (ref 70–99)
Potassium: 3.6 mmol/L (ref 3.5–5.1)
Sodium: 138 mmol/L (ref 135–145)
Total Bilirubin: 2.4 mg/dL — ABNORMAL HIGH (ref 0.3–1.2)
Total Protein: 6.2 g/dL — ABNORMAL LOW (ref 6.5–8.1)

## 2019-09-29 LAB — LIPASE: Lipase: >6000 U/L — ABNORMAL HIGH (ref 7–60)

## 2019-09-29 LAB — CBC
HCT: 44.8 % (ref 36.0–46.0)
Hemoglobin: 14.4 g/dL (ref 12.0–15.0)
MCH: 30.3 pg (ref 26.0–34.0)
MCHC: 32.1 g/dL (ref 30.0–36.0)
MCV: 94.3 fL (ref 80.0–100.0)
Platelets: 204 10*3/uL (ref 150–400)
RBC: 4.75 MIL/uL (ref 3.87–5.11)
RDW: 12.8 % (ref 11.5–15.5)
WBC: 18.5 10*3/uL — ABNORMAL HIGH (ref 4.0–10.5)
nRBC: 0 % (ref 0.0–0.2)

## 2019-09-29 LAB — HIV ANTIBODY (ROUTINE TESTING W REFLEX): HIV Screen 4th Generation wRfx: NONREACTIVE

## 2019-09-29 SURGERY — ERCP, WITH INTERVENTION IF INDICATED
Anesthesia: General | Site: Mouth | Wound class: Clean Contaminated

## 2019-09-29 MED ORDER — LIDOCAINE HCL (CARDIAC) PF 50 MG/5ML IV SOSY
PREFILLED_SYRINGE | INTRAVENOUS | Status: DC | PRN
  Administered 2019-09-29: 60 mg via INTRAVENOUS

## 2019-09-29 MED ORDER — PROPOFOL 10 MG/ML IV BOLUS
INTRAVENOUS | Status: AC
  Filled 2019-09-29: qty 20

## 2019-09-29 MED ORDER — ONDANSETRON HCL 4 MG/2ML IJ SOLN
INTRAMUSCULAR | Status: DC | PRN
  Administered 2019-09-29: 4 mg via INTRAVENOUS

## 2019-09-29 MED ORDER — ONDANSETRON HCL 4 MG/2ML IJ SOLN: 4.0000 mg | Freq: Once | INTRAMUSCULAR | Status: DC | PRN

## 2019-09-29 MED ORDER — LACTATED RINGERS IV SOLN
Freq: Once | INTRAVENOUS | Status: AC
  Administered 2019-09-29: 1000 mL via INTRAVENOUS

## 2019-09-29 MED ORDER — MEPERIDINE HCL 50 MG/ML IJ SOLN: 6.2500 mg | INTRAMUSCULAR | Status: DC | PRN

## 2019-09-29 MED ORDER — WATER FOR IRRIGATION, STERILE IR SOLN
Status: DC | PRN
  Administered 2019-09-29: 1000 mL via SURGICAL_CAVITY

## 2019-09-29 MED ORDER — PROPOFOL 10 MG/ML IV BOLUS
INTRAVENOUS | Status: DC | PRN
  Administered 2019-09-29: 175 mg via INTRAVENOUS

## 2019-09-29 MED ORDER — MIDAZOLAM HCL 2 MG/2ML IJ SOLN
INTRAMUSCULAR | Status: AC
  Filled 2019-09-29: qty 2

## 2019-09-29 MED ORDER — MIDAZOLAM HCL 5 MG/5ML IJ SOLN
INTRAMUSCULAR | Status: DC | PRN
  Administered 2019-09-29: 2 mg via INTRAVENOUS

## 2019-09-29 MED ORDER — HYDROMORPHONE HCL 1 MG/ML IJ SOLN: 1.0000 mg | INTRAMUSCULAR | Status: DC | PRN

## 2019-09-29 MED ORDER — SODIUM CHLORIDE 0.9 % IV SOLN: INTRAVENOUS | Status: DC

## 2019-09-29 MED ORDER — FENTANYL CITRATE (PF) 100 MCG/2ML IJ SOLN
INTRAMUSCULAR | Status: AC
  Filled 2019-09-29: qty 2

## 2019-09-29 MED ORDER — HYDROMORPHONE HCL 1 MG/ML IJ SOLN
1.0000 mg | INTRAMUSCULAR | Status: DC | PRN
  Administered 2019-09-29 – 2019-10-03 (×23): 1 mg via INTRAVENOUS
  Filled 2019-09-29 (×23): qty 1

## 2019-09-29 MED ORDER — HYDROMORPHONE HCL 1 MG/ML IJ SOLN: 0.2500 mg | INTRAMUSCULAR | Status: DC | PRN

## 2019-09-29 MED ORDER — ONDANSETRON HCL 4 MG/2ML IJ SOLN
INTRAMUSCULAR | Status: AC
  Filled 2019-09-29: qty 2

## 2019-09-29 MED ORDER — SUCCINYLCHOLINE 20MG/ML (10ML) SYRINGE FOR MEDFUSION PUMP - OPTIME
INTRAMUSCULAR | Status: DC | PRN
  Administered 2019-09-29: 120 mg via INTRAVENOUS

## 2019-09-29 MED ORDER — FENTANYL CITRATE (PF) 100 MCG/2ML IJ SOLN
INTRAMUSCULAR | Status: DC | PRN
  Administered 2019-09-29: 100 ug via INTRAVENOUS

## 2019-09-29 NOTE — Op Note (Signed)
[facility] Patient Name: Donna Vega Procedure Date: 09/29/2019 7:10 AM MRN: [id number] Date of Birth: [birth year] Attending MD: Hildred Laser , MD CSN: [id number] Age: 53 Admit Type: Inpatient Procedure:                ERCP Indications:              For therapy of acute pancreatitis, Gallstone                            associated acute pancreatitis Providers:                Hildred Laser, MD, Janeece Riggers, RN, Aram Candela Referring MD:             Heath Lark, DO Medicines:                General Anesthesia Complications:            No immediate complications. Estimated Blood Loss:     Estimated blood loss: none. Procedure:                Pre-Anesthesia Assessment:                           - Prior to the procedure, a History and Physical                            was performed, and patient medications and                            allergies were reviewed. The patient's tolerance of                            previous anesthesia was also reviewed. The risks                            and benefits of the procedure and the sedation                            options and risks were discussed with the patient.                            All questions were answered, and informed consent                            was obtained. Prior Anticoagulants: The patient has                            taken no previous anticoagulant or antiplatelet                            agents. ASA Grade Assessment: III - A patient with                            severe systemic disease. After reviewing the risks  and benefits, the patient was deemed in                            satisfactory condition to undergo the procedure.                           After obtaining informed consent, the scope was                            passed under direct vision. Throughout the                            procedure, the patient's blood pressure, pulse, and   oxygen saturations were monitored continuously. The                            [id number]) scope was introduced through                            the mouth, and used to inject contrast into and                            used to inject contrast into the bile duct. The                            ERCP was accomplished without difficulty. The                            patient tolerated the procedure well. Scope In: 7:53:14 AM Scope Out: 8:11:25 AM Total Procedure Duration: 0 hours 18 minutes 11 seconds  Findings:      The scout film was normal. The esophagus was successfully intubated       under direct vision. The scope was advanced to a normal major papilla in       the descending duodenum without detailed examination of the pharynx,       larynx and associated structures, and upper GI tract. The upper GI tract       was grossly normal. The bile duct was deeply cannulated with the       Hydratome sphincterotome. Contrast was injected. I personally       interpreted the bile duct images. Ductal flow of contrast was adequate.       Image quality was adequate. Contrast extended to the entire biliary       tree. The common bile duct was mildly dilated. The biliary tree was       otherwise normal. The in the biliary system contained no stones. An 8 mm       biliary sphincterotomy was made with a braided Hydratome sphincterotome       using ERBE electrocautery. There was no post-sphincterotomy bleeding.       The biliary tree was swept with a 10 mm balloon starting at the upper       third of the main bile duct. Impression:               - The common bile duct was mildly dilated. Patent  long cystic duct.                           - A biliary sphincterotomy was performed.                           - The biliary tree was swept with balloon but no                            stones found.                           - Sphincterotomy adequate. Moderate Sedation:       Per Anesthesia Care Recommendation:           - Return patient to hospital ward for ongoing care.                           - NPO.                           - Continue present medications.                           - Avoid aspirin and nonsteroidal anti-inflammatory                            medicines for 3 days.                           - CBC, CMET and lipase in am.                           - Surgical consultation for cholecystectomy when                            patient has recovered from pancreatitis. Procedure Code(s):        --- Professional ---                           [phone], Endoscopic retrograde                            cholangiopancreatography (ERCP); with                            sphincterotomy/papillotomy Diagnosis Code(s):        --- Professional ---                           K85.90, Acute pancreatitis without necrosis or                            infection, unspecified                           K85.10, Biliary acute pancreatitis without necrosis  or infection                           K83.8, Other specified diseases of biliary tract CPT copyright 2019 American [employer] Association. All rights reserved. The codes documented in this report are preliminary and upon coder review may  be revised to meet current compliance requirements. Hildred Laser, MD Hildred Laser, MD 09/29/2019 8:45:45 AM This report has been signed electronically. Number of Addenda: 0

## 2019-09-29 NOTE — Progress Notes (Signed)
Subjective:  Patient says she feels better.  She says abdominal pain is well controlled with pain medication.  She denies chest pain or shortness of breath.  She feels full and bloated.  She says she is passed urine few times since she was seen by me yesterday afternoon.  Current Facility-Administered Medications:  .  0.9 %  sodium chloride infusion, , Intravenous, Continuous, Rehman, Mechele Dawley, MD .  Doug Sou Hold] acetaminophen (TYLENOL) tablet 650 mg, 650 mg, Oral, Q6H PRN **OR** [MAR Hold] acetaminophen (TYLENOL) suppository 650 mg, 650 mg, Rectal, Q6H PRN, Jani Gravel, MD .  Doug Sou Hold] cefTRIAXone (ROCEPHIN) 1 g in sodium chloride 0.9 % 100 mL IVPB, 1 g, Intravenous, Q24H, Rehman, Mechele Dawley, MD, Stopped at 09/28/19 1202 .  [MAR Hold] diphenhydrAMINE (BENADRYL) injection 25 mg, 25 mg, Intravenous, Q6H PRN, Jani Gravel, MD .  Doug Sou Hold] HYDROmorphone (DILAUDID) injection 0.5 mg, 0.5 mg, Intravenous, Q4H PRN, Jani Gravel, MD, 0.5 mg at 09/29/19 0217 .  lactated ringers infusion, , Intravenous, Continuous, Mahala Menghini, PA-C, Last Rate: 250 mL/hr at 09/29/19 0216 .  [MAR Hold] ondansetron (ZOFRAN) injection 4 mg, 4 mg, Intravenous, Q6H PRN, Jani Gravel, MD, 4 mg at 09/28/19 1742 .  [MAR Hold] pantoprazole (PROTONIX) injection 40 mg, 40 mg, Intravenous, Q24H, Jani Gravel, MD, 40 mg at 09/28/19 1779   Objective: Blood pressure 137/82, pulse 85, temperature 98.5 F (36.9 C), temperature source Oral, resp. rate 18, height _0  (1.727 m), weight 98.1 kg, SpO2 94 %. Patient is alert and in no acute distress. Cardiac exam with regular rhythm normal S1 and S2.  No murmur gallop noted. Auscultation lungs reveal vesicular breath sounds bilaterally. Abdomen is full.  Bowel sounds are normal.  She has mild generalized tenderness but moderate to severe tenderness in midepigastric she region with some guarding.   Labs/studies Results:  CBC Latest Ref Rng & Units 09/29/2019 09/28/2019 09/28/2019  WBC 4.0 -  10.5 K/uL 18.5(H) 17.2(H) 12.2(H)  Hemoglobin 12.0 - 15.0 g/dL 14.4 16.0(H) 16.1(H)  Hematocrit 36.0 - 46.0 % 44.8 50.0(H) 50.0(H)  Platelets 150 - 400 K/uL 204 195 212    CMP Latest Ref Rng & Units 09/29/2019 09/28/2019 09/28/2019  Glucose 70 - 99 mg/dL 108(H) 132(H) 175(H)  BUN 6 - 20 mg/dL _1 Creatinine 0.44 - 1.00 mg/dL 0.63 0.74 0.84  Sodium 135 - 145 mmol/L 138 141 137  Potassium 3.5 - 5.1 mmol/L 3.6 4.2 3.9  Chloride 98 - 111 mmol/L 105 102 100  CO2 22 - 32 mmol/L _2 Calcium 8.9 - 10.3 mg/dL 7.9(L) 8.7(L) 9.1  Total Protein 6.5 - 8.1 g/dL 6.2(L) - 8.4(H)  Total Bilirubin 0.3 - 1.2 mg/dL 2.4(H) - 5.8(H)  Alkaline Phos 38 - 126 U/L 371(H) - 540(H)  AST 15 - 41 U/L 106(H) - 330(H)  ALT 0 - 44 U/L 257(H) - 468(H)    Hepatic Function Latest Ref Rng & Units 09/29/2019 09/28/2019 09/20/2019  Total Protein 6.5 - 8.1 g/dL 6.2(L) 8.4(H) 7.7  Albumin 3.5 - 5.0 g/dL 2.9(L) 4.2 -  AST 15 - 41 U/L 106(H) 330(H) 107(H)  ALT 0 - 44 U/L 257(H) 468(H) 259(H)  Alk Phosphatase 38 - 126 U/L 371(H) 540(H) -  Total Bilirubin 0.3 - 1.2 mg/dL 2.4(H) 5.8(H) 1.0  Bilirubin, Direct 0.0 - 0.2 mg/dL - - 0.3(H)    Serum lipase is 1159; it was 3832 yesterday  Assessment:  #1.  Biliary pancreatitis.  She has 2 calcified  stones in distal CBD.  The stones were first noted on CT last week.  Her transaminases and bilirubin are down.  Doubt that she has passed the stone spontaneously.  Therefore she would benefit from therapeutic ERCP as pancreatitis could worsen. Serum lipase is down.  She is well-hydrated.  Renal function is preserved.  Serum calcium agrees not unexpected. Patient is on ceftriaxone.  Patient has been explained that she may not be able to eat for few days and that is because of her pancreatitis.   Plan:  Proceed with ERCP with sphincterotomy.  I have reviewed procedures with the patient 1 more time and she is agreeable.

## 2019-09-29 NOTE — Transfer of Care (Signed)
Immediate Anesthesia Transfer of Care Note  Patient: Donna Vega  Procedure(s) Performed: ENDOSCOPIC RETROGRADE CHOLANGIOPANCREATOGRAPHY (ERCP) With sphincterotomy and stone extraction. (N/A )  Patient Location: PACU  Anesthesia Type:General  Level of Consciousness: awake  Airway & Oxygen Therapy: Patient Spontanous Breathing  Post-op Assessment: Report given to RN  Post vital signs: Reviewed and stable  Last Vitals:  Vitals Value Taken Time  BP 137/83 09/29/19 0833  Temp    Pulse 94 09/29/19 0837  Resp 31 09/29/19 0837  SpO2 100 % 09/29/19 0837  Vitals shown include unvalidated device data.  Last Pain:  Vitals:   09/29/19 0723  TempSrc:   PainSc: 9       Patients Stated Pain Goal: 9 ([id number])  Complications: No apparent anesthesia complications

## 2019-09-29 NOTE — Consult Note (Signed)
[facility] Consult  Reason for Consult: Gallstone Pancreatitis  Referring Physician:  Dr. Manuella Ghazi   Chief Complaint    Abdominal Pain      HPI: Donna Vega is a 53 y.o. female with a history of GERD, depression and prior melanoma of her leg. She came in with reports of having some chronic abdominal pain for about 1 month, some weight loss, and worsening pain that intensified over the beginning of this week and continued to progress. The pain was severe and sharp in nature in her epigastric area. She was worked up in the ED and found to have pancreatitis with obvious CBD stone on CT scan with biliary dilation. She was admitted and underwent an ERCP today with Dr. Laural Golden.  He performed a sphincterotomy and swept the duct, but no stone was removed.  Her lipase since admission as come down from >6000 to 1100 today. She has a leukocytosis that is trending up but a T bili that is trending down.  She says her pain is improving after the ERCP.  She has been on IVFs and prophylactic antibiotics given her extent of pancreatitis and the leukocytosis.   Past Medical History:  Diagnosis Date  . Depression   . Eating disorder   . GERD (gastroesophageal reflux disease)   . Melanoma ([place])    left leg    Past Surgical History:  Procedure Laterality Date  . TUBAL LIGATION  1997    Family History  Problem Relation Age of Onset  . Colon cancer Mother 63       colon   . Hypertension Father   . Hyperlipidemia Father   . Liver disease Neg Hx     Social History   Tobacco Use  . Smoking status: Never Smoker  . Smokeless tobacco: Never Used  Substance Use Topics  . Alcohol use: Not Currently    Comment: 2-3 beers after each evening for 6 years  . Drug use: No    Medications: I have reviewed the patient's current medications. Current Facility-Administered Medications  Medication Dose Route Frequency Provider Last Rate Last Dose  . acetaminophen (TYLENOL) tablet 650 mg  650  mg Oral Q6H PRN Rogene Houston, MD   650 mg at 09/29/19 1402   Or  . acetaminophen (TYLENOL) suppository 650 mg  650 mg Rectal Q6H PRN Rehman, Najeeb U, MD      . cefTRIAXone (ROCEPHIN) 1 g in sodium chloride 0.9 % 100 mL IVPB  1 g Intravenous Q24H Rehman, Najeeb U, MD 200 mL/hr at 09/29/19 1057 1 g at 09/29/19 1057  . diphenhydrAMINE (BENADRYL) injection 25 mg  25 mg Intravenous Q6H PRN Rehman, Najeeb U, MD      . HYDROmorphone (DILAUDID) injection 1 mg  1 mg Intravenous Q3H PRN Manuella Ghazi, Pratik D, DO   1 mg at 09/29/19 1722  . lactated ringers infusion   Intravenous Continuous Rogene Houston, MD 250 mL/hr at 09/29/19 1538    . ondansetron (ZOFRAN) injection 4 mg  4 mg Intravenous Q6H PRN Rogene Houston, MD   4 mg at 09/28/19 1742  . pantoprazole (PROTONIX) injection 40 mg  40 mg Intravenous Q24H Rehman, Mechele Dawley, MD   40 mg at 09/29/19 1056    No Known Allergies   ROS:  A comprehensive review of systems was negative except for: Gastrointestinal: positive for abdominal pain, nausea and vomiting  Blood pressure 132/71, pulse 82, temperature (!) 97.5 F (36.4 C), temperature source Oral, resp. rate  16, height 5\' 8"  (1.727 m), weight 98.1 kg, SpO2 96 %. Physical Exam Vitals signs reviewed.  Constitutional:      Appearance: She is well-developed.  HENT:     Head: Normocephalic.  Eyes:     Extraocular Movements: Extraocular movements intact.     Pupils: Pupils are equal, round, and reactive to light.  Cardiovascular:     Rate and Rhythm: Normal rate.  Pulmonary:     Effort: Pulmonary effort is normal.  Abdominal:     Palpations: Abdomen is soft.     Tenderness: There is abdominal tenderness in the epigastric area. There is guarding. There is no rebound.     Hernia: No hernia is present.  Musculoskeletal:     Comments: Moves all extremities  Skin:    General: Skin is warm and dry.  Neurological:     General: No focal deficit present.     Mental Status: She is alert and oriented  to person, place, and time.  Psychiatric:        Mood and Affect: Mood normal.        Behavior: Behavior normal.     Results: Results for orders placed or performed during the hospital encounter of 09/28/19 (from the past 48 hour(s))  CBC with Differential     Status: Abnormal   Collection Time: 09/28/19  2:53 AM  Result Value Ref Range   WBC 12.2 (H) 4.0 - 10.5 K/uL   RBC 5.40 (H) 3.87 - 5.11 MIL/uL   Hemoglobin 16.1 (H) 12.0 - 15.0 g/dL   HCT 50.0 (H) 36.0 - 46.0 %   MCV 92.6 80.0 - 100.0 fL   MCH 29.8 26.0 - 34.0 pg   MCHC 32.2 30.0 - 36.0 g/dL   RDW 12.4 11.5 - 15.5 %   Platelets 212 150 - 400 K/uL   nRBC 0.0 0.0 - 0.2 %   Neutrophils Relative % 92 %   Neutro Abs 11.1 (H) 1.7 - 7.7 K/uL   Lymphocytes Relative 4 %   Lymphs Abs 0.5 (L) 0.7 - 4.0 K/uL   Monocytes Relative 4 %   Monocytes Absolute 0.5 0.1 - 1.0 K/uL   Eosinophils Relative 0 %   Eosinophils Absolute 0.0 0.0 - 0.5 K/uL   Basophils Relative 0 %   Basophils Absolute 0.0 0.0 - 0.1 K/uL   Immature Granulocytes 0 %   Abs Immature Granulocytes 0.05 0.00 - 0.07 K/uL    Comment: Performed at [facility], [address] [place], [place] [address]  Comprehensive metabolic panel     Status: Abnormal   Collection Time: 09/28/19  2:53 AM  Result Value Ref Range   Sodium 137 135 - 145 mmol/L   Potassium 3.9 3.5 - 5.1 mmol/L   Chloride 100 98 - 111 mmol/L   CO2 26 22 - 32 mmol/L   Glucose, Bld 175 (H) 70 - 99 mg/dL   BUN 20 6 - 20 mg/dL   Creatinine, Ser 0.84 0.44 - 1.00 mg/dL   Calcium 9.1 8.9 - 10.3 mg/dL   Total Protein 8.4 (H) 6.5 - 8.1 g/dL   Albumin 4.2 3.5 - 5.0 g/dL   AST 330 (H) 15 - 41 U/L    Comment: RESULTS CONFIRMED BY MANUAL DILUTION   ALT 468 (H) 0 - 44 U/L   Alkaline Phosphatase 540 (H) 38 - 126 U/L   Total Bilirubin 5.8 (H) 0.3 - 1.2 mg/dL   GFR calc non Af Amer >60 >60 mL/min   GFR  calc Af Amer >60 >60 mL/min   Anion gap 11 5 - 15    Comment: Performed at [facility], [address],  [place], [place] [address]  Lipase, blood     Status: Abnormal   Collection Time: 09/28/19  2:53 AM  Result Value Ref Range   Lipase 3,832 (H) 11 - 51 U/L    Comment: RESULTS CONFIRMED BY MANUAL DILUTION Performed at [facility], [address], [place], [place] [address]   Ethanol     Status: None   Collection Time: 09/28/19  2:53 AM  Result Value Ref Range   Alcohol, Ethyl (B) <10 <10 mg/dL    Comment: (NOTE) Lowest detectable limit for serum alcohol is 10 mg/dL. For medical purposes only. Performed at [facility], [address] [place], [place] [address]   Lactate dehydrogenase     Status: Abnormal   Collection Time: 09/28/19  2:53 AM  Result Value Ref Range   LDH 415 (H) 98 - 192 U/L    Comment: Performed at [facility], [address], [place], [place] [address]  Lipid panel     Status: Abnormal   Collection Time: 09/28/19  2:53 AM  Result Value Ref Range   Cholesterol 265 (H) 0 - 200 mg/dL   Triglycerides 45 <150 mg/dL   HDL 82 >40 mg/dL   Total CHOL/HDL Ratio 3.2 RATIO   VLDL 9 0 - 40 mg/dL   LDL Cholesterol 174 (H) 0 - 99 mg/dL    Comment:        Total Cholesterol/HDL:CHD Risk Coronary Heart Disease Risk Table                     Men   Women  1/2 Average Risk   3.4   3.3  Average Risk       5.0   4.4  2 X Average Risk   9.6   7.1  3 X Average Risk  23.4   11.0        Use the calculated Patient Ratio above and the CHD Risk Table to determine the patient's CHD Risk.        ATP III CLASSIFICATION (LDL):  <100     mg/dL   Optimal  100-129  mg/dL   Near or Above                    Optimal  130-159  mg/dL   Borderline  160-189  mg/dL   High  >190     mg/dL   Very High Performed at [address] [place], [place] [address]   SARS Coronavirus 2 by RT PCR (hospital order, performed in [facility] hospital lab) Nasopharyngeal Nasopharyngeal Swab     Status: None   Collection Time: 09/28/19  6:30 AM   Specimen: Nasopharyngeal Swab  Result Value Ref Range    SARS Coronavirus 2 NEGATIVE NEGATIVE    Comment: (NOTE) If result is NEGATIVE SARS-CoV-2 target nucleic acids are NOT DETECTED. The SARS-CoV-2 RNA is generally detectable in upper and lower  respiratory specimens during the acute phase of infection. The lowest  concentration of SARS-CoV-2 viral copies this assay can detect is 250  copies / mL. A negative result does not preclude SARS-CoV-2 infection  and should not be used as the sole basis for treatment or other  patient management decisions.  A negative result may occur with  improper specimen collection / handling, submission of specimen other  than nasopharyngeal swab, presence of viral mutation(s) within the  areas targeted by this assay, and inadequate number of viral copies  (<250 copies / mL). A negative result must be combined with clinical  observations, patient history, and epidemiological information. If result is POSITIVE SARS-CoV-2 target nucleic acids are DETECTED. The SARS-CoV-2 RNA is generally detectable in upper and lower  respiratory specimens dur ing the acute phase of infection.  Positive  results are indicative of active infection with SARS-CoV-2.  Clinical  correlation with patient history and other diagnostic information is  necessary to determine patient infection status.  Positive results do  not rule out bacterial infection or co-infection with other viruses. If result is PRESUMPTIVE POSTIVE SARS-CoV-2 nucleic acids MAY BE PRESENT.   A presumptive positive result was obtained on the submitted specimen  and confirmed on repeat testing.  While 2019 novel coronavirus  (SARS-CoV-2) nucleic acids may be present in the submitted sample  additional confirmatory testing may be necessary for epidemiological  and / or clinical management purposes  to differentiate between  SARS-CoV-2 and other Sarbecovirus currently known to infect humans.  If clinically indicated additional testing with an alternate test   methodology [phone]) is advised. The SARS-CoV-2 RNA is generally  detectable in upper and lower respiratory sp ecimens during the acute  phase of infection. The expected result is Negative. Fact Sheet for Patients:  StrictlyIdeas.no Fact Sheet for Healthcare Providers: BankingDealers.co.za This test is not yet approved or cleared by the [place] FDA and has been authorized for detection and/or diagnosis of SARS-CoV-2 by FDA under an Emergency Use Authorization (EUA).  This EUA will remain in effect (meaning this test can be used) for the duration of the COVID-19 declaration under Section 564(b)(1) of the Act, 21 U.S.C. section 360bbb-3(b)(1), unless the authorization is terminated or revoked sooner. Performed at [facility], [address], [place], [place] [address]   CBC     Status: Abnormal   Collection Time: 09/28/19 11:52 AM  Result Value Ref Range   WBC 17.2 (H) 4.0 - 10.5 K/uL   RBC 5.34 (H) 3.87 - 5.11 MIL/uL   Hemoglobin 16.0 (H) 12.0 - 15.0 g/dL   HCT 50.0 (H) 36.0 - 46.0 %   MCV 93.6 80.0 - 100.0 fL   MCH 30.0 26.0 - 34.0 pg   MCHC 32.0 30.0 - 36.0 g/dL   RDW 12.8 11.5 - 15.5 %   Platelets 195 150 - 400 K/uL   nRBC 0.0 0.0 - 0.2 %    Comment: Performed at [facility], [address], [place], [place] [id number]  Basic metabolic panel     Status: Abnormal   Collection Time: 09/28/19 11:52 AM  Result Value Ref Range   Sodium 141 135 - 145 mmol/L   Potassium 4.2 3.5 - 5.1 mmol/L   Chloride 102 98 - 111 mmol/L   CO2 27 22 - 32 mmol/L   Glucose, Bld 132 (H) 70 - 99 mg/dL   BUN 19 6 - 20 mg/dL   Creatinine, Ser 0.74 0.44 - 1.00 mg/dL   Calcium 8.7 (L) 8.9 - 10.3 mg/dL   GFR calc non Af Amer >60 >60 mL/min   GFR calc Af Amer >60 >60 mL/min   Anion gap 12 5 - 15    Comment: Performed at [facility], [address], [place], [place] [address]  Comprehensive metabolic panel     Status: Abnormal   Collection Time:  09/29/19  5:07 AM  Result Value Ref  Range   Sodium 138 135 - 145 mmol/L   Potassium 3.6 3.5 - 5.1 mmol/L   Chloride 105 98 - 111 mmol/L   CO2 23 22 - 32 mmol/L   Glucose, Bld 108 (H) 70 - 99 mg/dL   BUN 19 6 - 20 mg/dL   Creatinine, Ser 0.63 0.44 - 1.00 mg/dL   Calcium 7.9 (L) 8.9 - 10.3 mg/dL   Total Protein 6.2 (L) 6.5 - 8.1 g/dL   Albumin 2.9 (L) 3.5 - 5.0 g/dL   AST 106 (H) 15 - 41 U/L   ALT 257 (H) 0 - 44 U/L   Alkaline Phosphatase 371 (H) 38 - 126 U/L   Total Bilirubin 2.4 (H) 0.3 - 1.2 mg/dL   GFR calc non Af Amer >60 >60 mL/min   GFR calc Af Amer >60 >60 mL/min   Anion gap 10 5 - 15    Comment: Performed at [facility], [address], [place], [place] [address]  CBC     Status: Abnormal   Collection Time: 09/29/19  5:07 AM  Result Value Ref Range   WBC 18.5 (H) 4.0 - 10.5 K/uL   RBC 4.75 3.87 - 5.11 MIL/uL   Hemoglobin 14.4 12.0 - 15.0 g/dL   HCT 44.8 36.0 - 46.0 %   MCV 94.3 80.0 - 100.0 fL   MCH 30.3 26.0 - 34.0 pg   MCHC 32.1 30.0 - 36.0 g/dL   RDW 12.8 11.5 - 15.5 %   Platelets 204 150 - 400 K/uL   nRBC 0.0 0.0 - 0.2 %    Comment: Performed at [facility], [address] [place], [place] [address]  Lipase, blood     Status: Abnormal   Collection Time: 09/29/19  5:07 AM  Result Value Ref Range   Lipase 1,159 (H) 11 - 51 U/L    Comment: RESULTS CONFIRMED BY MANUAL DILUTION Performed at [facility], [address], [place], [place] [address]    Personally reviewed CT- dilated ducts and edematous duodenum and around the pancreas, gallbladder without obvious thickening, some fluid  Ct Abdomen Pelvis W Contrast  Result Date: 09/28/2019 CLINICAL DATA:  Acute generalized abdominal pain EXAM: CT ABDOMEN AND PELVIS WITH CONTRAST TECHNIQUE: Multidetector CT imaging of the abdomen and pelvis was performed using the standard protocol following bolus administration of intravenous contrast. CONTRAST:  140mL OMNIPAQUE IOHEXOL 300 MG/ML  SOLN COMPARISON:  Five days ago  FINDINGS: Lower chest: Subpleural nodule over the right diaphragm most consistent with lymph node. Hepatobiliary: New prominent bile duct dilatation that is both intra and extrahepatic. Two calcified stones are seen at the level of the distal CBD just before the ampulla, measuring up to 3 mm.Small cystic density in the right liver. Fluid around the gallbladder is likely secondary. No wall thickening typical of acute cholecystitis. Pancreas: New generalized pancreatic expansion and peripancreatic edema without organized collection or necrosis. Spleen: Unremarkable. Adrenals/Urinary Tract: Negative adrenals. No hydronephrosis or stone. Unremarkable bladder. Stomach/Bowel:  No obstruction. Moderate left colonic diverticulosis Vascular/Lymphatic: No acute vascular abnormality. No mass or adenopathy. Reproductive:No pathologic findings. Other: No ascites or pneumoperitoneum. Musculoskeletal: No acute abnormalities.  L5-S1 disc degeneration IMPRESSION: Acute edematous pancreatitis without organized collection. The cause is biliary, there is new marked biliary dilatation with 2 small calculi at the level of the ampulla. Electronically Signed   By: Monte Fantasia M.D.   On: 09/28/2019 04:40   Dg Ercp Biliary & Pancreatic Ducts  Result Date: 09/29/2019 CLINICAL DATA:  Biliary pancreatitis  with calcified stones within the CBD. EXAM: ERCP TECHNIQUE: Multiple spot images obtained with the fluoroscopic device and submitted for interpretation post-procedure. COMPARISON:  CT abdomen and pelvis - 09/28/2019 FLUOROSCOPY TIME:  32 seconds FINDINGS: 4 spot intraoperative fluoroscopic image of the right upper abdominal quadrant are provided for review. Initial image demonstrates an ERCP probe over the right upper abdominal quadrant. Subsequent images demonstrate selective cannulation and opacification of the common bile duct which appears mildly dilated. There is opacification of the cystic duct with minimal opacification the  neck of the gallbladder. There is no significant opacification intrahepatic biliary system or the pancreatic duct. IMPRESSION: ERCP as above. These images were submitted for radiologic interpretation only. Please see the procedural report for the amount of contrast and the fluoroscopy time utilized. Electronically Signed   By: Sandi Mariscal M.D.   On: 09/29/2019 08:59     Assessment & Plan:  Donna Vega is a 53 y.o. female with gallstone pancreatitis that is improving. She is still NPO and having significant pain. She had her ERCP today. We discussed risk of repeat episodes of pancreatitis due to gallstones and discussed that this can occur as high as 30-40% of the time within the next month. We discussed cholecystectomy in the near future and the option of trying to do surgery while she is an inpatient.  She wants to proceed with this plan to avoid another episode.  Discussed risk of her developing sequela of pancreatitis like pseudocysts.   -Advance diet as pain improves  -Agree with resuscitation and antibiotics for prophylaxis  -Continue to trend labs  -Plan for Lap cholecystectomy possibly Tuesday if improving   All questions were answered to the satisfaction of the patient. Will discuss surgery further later.   Virl Cagey 09/29/2019, 11:40 AM

## 2019-09-29 NOTE — Progress Notes (Signed)
Initial Nutrition Assessment  DOCUMENTATION CODES:   Obesity unspecified  INTERVENTION:  N/A at this time-NPO  RD will continue to follow progression of care   NUTRITION DIAGNOSIS:   Inadequate oral intake related to inability to eat(acute biliary pancreatitis) as evidenced by NPO status.   GOAL:   Patient will meet greater than or equal to 90% of their needs   MONITOR:   Diet advancement, PO intake, Supplement acceptance, Labs, Weight trends  REASON FOR ASSESSMENT:   Malnutrition Screening Tool    ASSESSMENT: Patient is an obese 53 yo female with history of  GERD, Depression and eating disorder. She presents to ED  And diagnosed with biliary pancreatitis.   S/p ERCP with sphincterotomy morning. No stones found.  Patient is NPO and having discomfort during RD visit. Patient says she started experiencing intermittent abdominal pain about 1 month ago. Then 2 days acute onset and she has had very little intake since.   Her weight has not significantly changed. In fact per chart history it has increased ~1.5 kg the past 8 days. She reports usual of 190 lb (86.3 kg).     Medications reviewed and include: Protonix, Dilaudid prn.  Labs: BMP Latest Ref Rng & Units 09/29/2019 09/28/2019 09/28/2019  Glucose 70 - 99 mg/dL 108(H) 132(H) 175(H)  BUN 6 - 20 mg/dL 19 19 20   Creatinine 0.44 - 1.00 mg/dL 0.63 0.74 0.84  BUN/Creat Ratio 6 - 22 (calc) - - -  Sodium 135 - 145 mmol/L 138 141 137  Potassium 3.5 - 5.1 mmol/L 3.6 4.2 3.9  Chloride 98 - 111 mmol/L 105 102 100  CO2 22 - 32 mmol/L 23 27 26   Calcium 8.9 - 10.3 mg/dL 7.9(L) 8.7(L) 9.1     NUTRITION - FOCUSED PHYSICAL EXAM: Partial Nutrition-Focused physical exam completed. Findings are no fat depletion, no muscle depletion, and no edema.     Diet Order:   Diet Order            Diet NPO time specified Except for: Ice Chips  Diet effective now              EDUCATION NEEDS:   Not appropriate for education at  this time Skin:  Skin Assessment: Reviewed RN Assessment  Last BM:  11/18  Height:   Ht Readings from Last 1 Encounters:  09/28/19 5\' 8"  (1.727 m)    Weight:   Wt Readings from Last 1 Encounters:  09/28/19 98.1 kg    Ideal Body Weight:  64 kg  BMI:  Body mass index is 32.88 kg/m.  Estimated Nutritional Needs:   Kcal:  1800-1900 (MSJ x 1.1 AF +/- 100 kcal)  Protein:  83-96  Fluid:  1.8-1.9 liters daily    Colman Cater MS,RD,CSG,LDN Office: [phone] Pager: [phone]

## 2019-09-29 NOTE — Anesthesia Postprocedure Evaluation (Signed)
Anesthesia Post Note  Patient: Kemaya L Bonelli  Procedure(s) Performed: ENDOSCOPIC RETROGRADE CHOLANGIOPANCREATOGRAPHY (ERCP) WITH SPHINCTEROTOMY AND STONE EXTRACTION (N/A Mouth)  Patient location during evaluation: PACU Anesthesia Type: General Level of consciousness: awake and alert Pain management: pain level controlled Vital Signs Assessment: post-procedure vital signs reviewed and stable Respiratory status: spontaneous breathing Cardiovascular status: blood pressure returned to baseline and stable Postop Assessment: no apparent nausea or vomiting Anesthetic complications: no     Last Vitals:  Vitals:   09/29/19 0915 09/29/19 0942  BP: 138/78 132/71  Pulse: 84 82  Resp: 17 16  Temp:  (!) 36.4 C  SpO2: 94% 96%    Last Pain:  Vitals:   09/29/19 0942  TempSrc: Oral  PainSc:                  Tressie Stalker

## 2019-09-29 NOTE — Progress Notes (Signed)
PROGRESS NOTE    Donna Vega  [id number] DOB: [birth year] DOA: 09/28/2019 PCP: Tylene Fantasia, NP   Brief Narrative:  Per HPI: [name] a71 y.o.femalew depression, gerd, apparently has had intermittent abdominal pain for the past 1 month. Over the past 2 days the pain has worsened. Epgiastric, sharp pain, Associated with n/v, but no bloody emesis. Pt denies fever, chills, cough, cp, palp, sob, diarrhea, brbpr, black stool, dysuria, hematuria.   Patient was noted to have biliary pancreatitis noted on CT abdomen and pelvis along with lipase elevation of 3832.  She has been seen by GI today with plans to keep n.p.o. with aggressive fluid resuscitation.  11/20: Patient has undergone ERCP this morning with biliary sphincterotomy and no common bile duct stone noted.  GI recommends to stay off heparin agents for the next 72 hours.  General surgery consulted for cholecystectomy once pancreatitis resolves.  Appreciate further recommendations.  She is to remain n.p.o. with ongoing IV fluid as she is still quite symptomatic.  LFTs and lipase is noted to be downtrending.  Will repeat labs in a.m.  Assessment & Plan:   Principal Problem:   Acute gallstone pancreatitis Active Problems:   Pancreatitis   Abnormal liver function   Depression   GERD (gastroesophageal reflux disease)   Biliary pancreatitis status post ERCP with sphincterotomy -Continue on aggressive IV fluid -Keep n.p.o. -Rocephin as prescribed for intra-abdominal coverage -Appreciate general surgery consultation for cholecystectomy once pancreatitis resolves -Zofran as needed for nausea vomiting -Dilaudid for pain management  Depression -Holding Paxil for now  GERD -Protonix 40 mg IV daily   DVT prophylaxis: SCDs Code Status: Full Family Communication: Discussed with husband, [name] at bedside Disposition Plan: Continue close monitoring with repeat labs in a.m. and maintain on IV fluid and keep  n.p.o.  Appreciate general surgery consultation for cholecystectomy.   Consultants:   GI  General surgery  Procedures:   None  Antimicrobials:  Anti-infectives (From admission, onward)   Start     Dose/Rate Route Frequency Ordered Stop   09/28/19 1030  cefTRIAXone (ROCEPHIN) 1 g in sodium chloride 0.9 % 100 mL IVPB     1 g 200 mL/hr over 30 Minutes Intravenous Every 24 hours 09/28/19 1016         Subjective: Patient seen and evaluated today with ongoing abdominal pain as well as some mild nausea.  She has undergone ERCP this morning.  Objective: Vitals:   09/29/19 0845 09/29/19 0900 09/29/19 0915 09/29/19 0942  BP: (!) 141/87 (!) 148/79 138/78 132/71  Pulse: 87 85 84 82  Resp: (!) 23 11 17 16   Temp:    (!) 97.5 F (36.4 C)  TempSrc:    Oral  SpO2: 100% 100% 94% 96%  Weight:      Height:        Intake/Output Summary (Last 24 hours) at 09/29/2019 1124 Last data filed at 09/29/2019 0838 Gross per 24 hour  Intake 4406.91 ml  Output 0 ml  Net 4406.91 ml   Filed Weights   09/28/19 0222 09/28/19 2140  Weight: 96.6 kg 98.1 kg    Examination:  General exam: Appears calm and comfortable  Respiratory system: Clear to auscultation. Respiratory effort normal. Cardiovascular system: S1 & S2 heard, RRR. No JVD, murmurs, rubs, gallops or clicks. No pedal edema. Gastrointestinal system: Abdomen is nondistended, soft and nontender. No organomegaly or masses felt. Normal bowel sounds heard. Central nervous system: Alert and oriented. No focal neurological deficits. Extremities: Symmetric 5 x 5  power. Skin: No rashes, lesions or ulcers Psychiatry: Judgement and insight appear normal. Mood & affect appropriate.     Data Reviewed: I have personally reviewed following labs and imaging studies  CBC: Recent Labs  Lab 09/28/19 0253 09/28/19 1152 09/29/19 0507  WBC 12.2* 17.2* 18.5*  NEUTROABS 11.1*  --   --   HGB 16.1* 16.0* 14.4  HCT 50.0* 50.0* 44.8  MCV 92.6 93.6  94.3  PLT 212 195 0000000   Basic Metabolic Panel: Recent Labs  Lab 09/28/19 0253 09/28/19 1152 09/29/19 0507  NA 137 141 138  K 3.9 4.2 3.6  CL 100 102 105  CO2 26 27 23   GLUCOSE 175* 132* 108*  BUN 20 19 19   CREATININE 0.84 0.74 0.63  CALCIUM 9.1 8.7* 7.9*   GFR: Estimated Creatinine Clearance: 101.9 mL/min (by C-G formula based on SCr of 0.63 mg/dL). Liver Function Tests: Recent Labs  Lab 09/28/19 0253 09/29/19 0507  AST 330* 106*  ALT 468* 257*  ALKPHOS 540* 371*  BILITOT 5.8* 2.4*  PROT 8.4* 6.2*  ALBUMIN 4.2 2.9*   Recent Labs  Lab 09/28/19 0253 09/29/19 0507  LIPASE 3,832* 1,159*   No results for input(s): AMMONIA in the last 168 hours. Coagulation Profile: No results for input(s): INR, PROTIME in the last 168 hours. Cardiac Enzymes: No results for input(s): CKTOTAL, CKMB, CKMBINDEX, TROPONINI in the last 168 hours. BNP (last 3 results) No results for input(s): PROBNP in the last 8760 hours. HbA1C: No results for input(s): HGBA1C in the last 72 hours. CBG: No results for input(s): GLUCAP in the last 168 hours. Lipid Profile: Recent Labs    09/28/19 0253  CHOL 265*  HDL 82  LDLCALC 174*  TRIG 45  CHOLHDL 3.2   Thyroid Function Tests: No results for input(s): TSH, T4TOTAL, FREET4, T3FREE, THYROIDAB in the last 72 hours. Anemia Panel: No results for input(s): VITAMINB12, FOLATE, FERRITIN, TIBC, IRON, RETICCTPCT in the last 72 hours. Sepsis Labs: No results for input(s): PROCALCITON, LATICACIDVEN in the last 168 hours.  Recent Results (from the past 240 hour(s))  SARS Coronavirus 2 by RT PCR (hospital order, performed in [facility] hospital lab) Nasopharyngeal Nasopharyngeal Swab     Status: None   Collection Time: 09/28/19  6:30 AM   Specimen: Nasopharyngeal Swab  Result Value Ref Range Status   SARS Coronavirus 2 NEGATIVE NEGATIVE Final    Comment: (NOTE) If result is NEGATIVE SARS-CoV-2 target nucleic acids are NOT DETECTED. The SARS-CoV-2  RNA is generally detectable in upper and lower  respiratory specimens during the acute phase of infection. The lowest  concentration of SARS-CoV-2 viral copies this assay can detect is 250  copies / mL. A negative result does not preclude SARS-CoV-2 infection  and should not be used as the sole basis for treatment or other  patient management decisions.  A negative result may occur with  improper specimen collection / handling, submission of specimen other  than nasopharyngeal swab, presence of viral mutation(s) within the  areas targeted by this assay, and inadequate number of viral copies  (<250 copies / mL). A negative result must be combined with clinical  observations, patient history, and epidemiological information. If result is POSITIVE SARS-CoV-2 target nucleic acids are DETECTED. The SARS-CoV-2 RNA is generally detectable in upper and lower  respiratory specimens dur ing the acute phase of infection.  Positive  results are indicative of active infection with SARS-CoV-2.  Clinical  correlation with patient history and other diagnostic information is  necessary to determine patient infection status.  Positive results do  not rule out bacterial infection or co-infection with other viruses. If result is PRESUMPTIVE POSTIVE SARS-CoV-2 nucleic acids MAY BE PRESENT.   A presumptive positive result was obtained on the submitted specimen  and confirmed on repeat testing.  While 2019 novel coronavirus  (SARS-CoV-2) nucleic acids may be present in the submitted sample  additional confirmatory testing may be necessary for epidemiological  and / or clinical management purposes  to differentiate between  SARS-CoV-2 and other Sarbecovirus currently known to infect humans.  If clinically indicated additional testing with an alternate test  methodology [phone]) is advised. The SARS-CoV-2 RNA is generally  detectable in upper and lower respiratory sp ecimens during the acute  phase of  infection. The expected result is Negative. Fact Sheet for Patients:  StrictlyIdeas.no Fact Sheet for Healthcare Providers: BankingDealers.co.za This test is not yet approved or cleared by the [place] FDA and has been authorized for detection and/or diagnosis of SARS-CoV-2 by FDA under an Emergency Use Authorization (EUA).  This EUA will remain in effect (meaning this test can be used) for the duration of the COVID-19 declaration under Section 564(b)(1) of the Act, 21 U.S.C. section 360bbb-3(b)(1), unless the authorization is terminated or revoked sooner. Performed at [facility], [address] [place], [place] [address]          Radiology Studies: Ct Abdomen Pelvis W Contrast  Result Date: 09/28/2019 CLINICAL DATA:  Acute generalized abdominal pain EXAM: CT ABDOMEN AND PELVIS WITH CONTRAST TECHNIQUE: Multidetector CT imaging of the abdomen and pelvis was performed using the standard protocol following bolus administration of intravenous contrast. CONTRAST:  131mL OMNIPAQUE IOHEXOL 300 MG/ML  SOLN COMPARISON:  Five days ago FINDINGS: Lower chest: Subpleural nodule over the right diaphragm most consistent with lymph node. Hepatobiliary: New prominent bile duct dilatation that is both intra and extrahepatic. Two calcified stones are seen at the level of the distal CBD just before the ampulla, measuring up to 3 mm.Small cystic density in the right liver. Fluid around the gallbladder is likely secondary. No wall thickening typical of acute cholecystitis. Pancreas: New generalized pancreatic expansion and peripancreatic edema without organized collection or necrosis. Spleen: Unremarkable. Adrenals/Urinary Tract: Negative adrenals. No hydronephrosis or stone. Unremarkable bladder. Stomach/Bowel:  No obstruction. Moderate left colonic diverticulosis Vascular/Lymphatic: No acute vascular abnormality. No mass or adenopathy. Reproductive:No  pathologic findings. Other: No ascites or pneumoperitoneum. Musculoskeletal: No acute abnormalities.  L5-S1 disc degeneration IMPRESSION: Acute edematous pancreatitis without organized collection. The cause is biliary, there is new marked biliary dilatation with 2 small calculi at the level of the ampulla. Electronically Signed   By: Monte Fantasia M.D.   On: 09/28/2019 04:40   Dg Ercp Biliary & Pancreatic Ducts  Result Date: 09/29/2019 CLINICAL DATA:  Biliary pancreatitis with calcified stones within the CBD. EXAM: ERCP TECHNIQUE: Multiple spot images obtained with the fluoroscopic device and submitted for interpretation post-procedure. COMPARISON:  CT abdomen and pelvis - 09/28/2019 FLUOROSCOPY TIME:  32 seconds FINDINGS: 4 spot intraoperative fluoroscopic image of the right upper abdominal quadrant are provided for review. Initial image demonstrates an ERCP probe over the right upper abdominal quadrant. Subsequent images demonstrate selective cannulation and opacification of the common bile duct which appears mildly dilated. There is opacification of the cystic duct with minimal opacification the neck of the gallbladder. There is no significant opacification intrahepatic biliary system or the pancreatic duct. IMPRESSION: ERCP as above. These images were submitted for radiologic interpretation  only. Please see the procedural report for the amount of contrast and the fluoroscopy time utilized. Electronically Signed   By: Sandi Mariscal M.D.   On: 09/29/2019 08:59        Scheduled Meds:  pantoprazole (PROTONIX) IV  40 mg Intravenous Q24H   Continuous Infusions:  cefTRIAXone (ROCEPHIN)  IV 1 g (09/29/19 1057)   lactated ringers 250 mL/hr at 09/29/19 0216     LOS: 1 day    Time spent: 30 minutes    Jalaiya Oyster Darleen Crocker, DO [facility] Pager [phone]  If 7PM-7AM, please contact night-coverage [email or web address] Password [place] 09/29/2019, 11:24 AM

## 2019-09-29 NOTE — Progress Notes (Signed)
Brief ERCP note.  Normal limited examination of stomach. Normal ampulla of Vater. CBD easily cannulated. Mildly dilated common bile duct with normal intrahepatic biliary radicles.  Patent cystic duct. No definite filling defect noted. Biliary sphincterotomy performed and 10 mm balloon passed through the duct and across sphincterotomy.  No stones.  No bleeding noted.

## 2019-09-29 NOTE — Anesthesia Procedure Notes (Signed)
Procedure Name: Intubation Date/Time: 09/29/2019 7:39 AM Performed by: Ollen Bowl, CRNA Pre-anesthesia Checklist: Patient identified, Patient being monitored, Timeout performed, Emergency Drugs available and Suction available Patient Re-evaluated:Patient Re-evaluated prior to induction Oxygen Delivery Method: Circle system utilized Preoxygenation: Pre-oxygenation with 100% oxygen Induction Type: IV induction Ventilation: Mask ventilation without difficulty Laryngoscope Size: Mac and 3 Grade View: Grade I Tube type: Oral Tube size: 7.0 mm Number of attempts: 1 Airway Equipment and Method: Stylet Placement Confirmation: ETT inserted through vocal cords under direct vision,  positive ETCO2 and breath sounds checked- equal and bilateral Secured at: 21 cm Tube secured with: Tape Dental Injury: Teeth and Oropharynx as per pre-operative assessment

## 2019-09-30 DIAGNOSIS — K851 Biliary acute pancreatitis without necrosis or infection: Principal | ICD-10-CM

## 2019-09-30 LAB — COMPREHENSIVE METABOLIC PANEL
ALT: 128 U/L — ABNORMAL HIGH (ref 0–44)
AST: 36 U/L (ref 15–41)
Albumin: 2.5 g/dL — ABNORMAL LOW (ref 3.5–5.0)
Alkaline Phosphatase: 236 U/L — ABNORMAL HIGH (ref 38–126)
Anion gap: 9 (ref 5–15)
BUN: 10 mg/dL (ref 6–20)
CO2: 21 mmol/L — ABNORMAL LOW (ref 22–32)
Calcium: 7.9 mg/dL — ABNORMAL LOW (ref 8.9–10.3)
Chloride: 104 mmol/L (ref 98–111)
Creatinine, Ser: 0.61 mg/dL (ref 0.44–1.00)
GFR calc Af Amer: >60 mL/min (ref >60)
GFR calc non Af Amer: >60 mL/min (ref >60)
Glucose, Bld: 91 mg/dL (ref 70–99)
Potassium: 3.6 mmol/L (ref 3.5–5.1)
Sodium: 134 mmol/L — ABNORMAL LOW (ref 135–145)
Total Bilirubin: 1.5 mg/dL — ABNORMAL HIGH (ref 0.3–1.2)
Total Protein: 5.7 g/dL — ABNORMAL LOW (ref 6.5–8.1)

## 2019-09-30 LAB — CBC
HCT: 41.2 % (ref 36.0–46.0)
Hemoglobin: 13.1 g/dL (ref 12.0–15.0)
MCH: 30.6 pg (ref 26.0–34.0)
MCHC: 31.8 g/dL (ref 30.0–36.0)
MCV: 96.3 fL (ref 80.0–100.0)
Platelets: 123 10*3/uL — ABNORMAL LOW (ref 150–400)
RBC: 4.28 MIL/uL (ref 3.87–5.11)
RDW: 12.8 % (ref 11.5–15.5)
WBC: 15.9 10*3/uL — ABNORMAL HIGH (ref 4.0–10.5)
nRBC: 0 % (ref 0.0–0.2)

## 2019-09-30 LAB — LIPASE, BLOOD: Lipase: 117 U/L — ABNORMAL HIGH (ref 11–51)

## 2019-09-30 MED ORDER — PAROXETINE HCL 20 MG PO TABS
20.0000 mg | ORAL_TABLET | Freq: Every day | ORAL | Status: DC
  Administered 2019-09-30 – 2019-10-04 (×5): 20 mg via ORAL
  Filled 2019-09-30 (×5): qty 1

## 2019-09-30 MED ORDER — PANTOPRAZOLE SODIUM 40 MG PO TBEC
40.0000 mg | DELAYED_RELEASE_TABLET | Freq: Every day | ORAL | Status: DC
  Administered 2019-10-01 – 2019-10-04 (×4): 40 mg via ORAL
  Filled 2019-09-30 (×4): qty 1

## 2019-09-30 NOTE — Progress Notes (Signed)
   Assessment/Plan: ADMITTED WITH GALLSTONE PANCREATITIS. ERCP/SPHINCTEROTOMY NOV 20 AND NO CBD STONES SEEN. CLINICALLY IMPROVED.  PLAN: 1. SUPPORTIVE CARE. 2. CLEAR LIQUID DIET. 3. CHOLECYSTECTOMY TUES NOV 24.   Subjective: Since I last evaluated the patient HER NAUSEA/VOMITING HAS RESOLVED. EPIGASTRIC PAIN GETS WORSE AND GETS BETTER BUT NOT REALLY IMPROVED SINCE YESTERDAY.  Objective: Vital signs in last 24 hours: Vitals:   09/29/19 2102 09/30/19 0601  BP: 121/72 115/69  Pulse: 90 89  Resp: 20 20  Temp: 98.6 F (37 C) 98.5 F (36.9 C)  SpO2: 96% 92%   General appearance: alert, cooperative and no distress Resp: clear to auscultation bilaterally Cardio: regular rate and rhythm GI: soft, MILD tenderness in EPIGASTRIUM; bowel sounds normal;   Lab Results:  T BILI 1.5 ALBUMIN 2.5 AST 36 ALT 128 WBC 15.9 PLT CT 123   Studies/Results: Dg Abd 1 View  Result Date: 09/29/2019 CLINICAL DATA:  Abdominal pain. Gastroesophageal reflux disease. Melanoma. EXAM: ABDOMEN - 1 VIEW COMPARISON:  CT of 1 day prior FINDINGS: Two supine views. Gas-filled, non dilated large and small bowel loops. No free intraperitoneal air. Contrast within the colon. IMPRESSION: No acute findings. Electronically Signed   By: Abigail Miyamoto M.D.   On: 09/29/2019 20:36    Medications: I have reviewed the patient's current medications.

## 2019-09-30 NOTE — Progress Notes (Signed)
[facility] Progress Note  1 Day Post-Op  Subjective: Doing better and says pain is better. Some nausea. Just received some pain medication. Had her ERCP yesterday.   Objective: Vital signs in last 24 hours: Temp:  [97.5 F (36.4 C)-99.1 F (37.3 C)] 98.5 F (36.9 C) (11/21 0601) Pulse Rate:  [81-90] 89 (11/21 0601) Resp:  [16-20] 20 (11/21 0601) BP: (112-132)/(67-72) 115/69 (11/21 0601) SpO2:  [92 %-97 %] 92 % (11/21 0601) Weight:  [104.6 kg] 104.6 kg (11/21 0601) Last BM Date: 09/27/19  Intake/Output from previous day: 11/20 0701 - 11/21 0700 In: 900 [I.V.:900] Out: 0  Intake/Output this shift: No intake/output data recorded.  General appearance: alert, cooperative and no distress Resp: normal work of breathing GI: soft, mildly distended, moderately tender but improvnig from yesterday in epigastric region  Lab Results:  Recent Labs    09/29/19 0507 09/30/19 0740  WBC 18.5* 15.9*  HGB 14.4 13.1  HCT 44.8 41.2  PLT 204 123*   BMET Recent Labs    09/29/19 0507 09/30/19 0740  NA 138 134*  K 3.6 3.6  CL 105 104  CO2 23 21*  GLUCOSE 108* 91  BUN 19 10  CREATININE 0.63 0.61  CALCIUM 7.9* 7.9*    Studies/Results: Dg Abd 1 View  Result Date: 09/29/2019 CLINICAL DATA:  Abdominal pain. Gastroesophageal reflux disease. Melanoma. EXAM: ABDOMEN - 1 VIEW COMPARISON:  CT of 1 day prior FINDINGS: Two supine views. Gas-filled, non dilated large and small bowel loops. No free intraperitoneal air. Contrast within the colon. IMPRESSION: No acute findings. Electronically Signed   By: Abigail Miyamoto M.D.   On: 09/29/2019 20:36   Dg Ercp Biliary & Pancreatic Ducts  Result Date: 09/29/2019 CLINICAL DATA:  Biliary pancreatitis with calcified stones within the CBD. EXAM: ERCP TECHNIQUE: Multiple spot images obtained with the fluoroscopic device and submitted for interpretation post-procedure. COMPARISON:  CT abdomen and pelvis - 09/28/2019 FLUOROSCOPY TIME:  32  seconds FINDINGS: 4 spot intraoperative fluoroscopic image of the right upper abdominal quadrant are provided for review. Initial image demonstrates an ERCP probe over the right upper abdominal quadrant. Subsequent images demonstrate selective cannulation and opacification of the common bile duct which appears mildly dilated. There is opacification of the cystic duct with minimal opacification the neck of the gallbladder. There is no significant opacification intrahepatic biliary system or the pancreatic duct. IMPRESSION: ERCP as above. These images were submitted for radiologic interpretation only. Please see the procedural report for the amount of contrast and the fluoroscopy time utilized. Electronically Signed   By: Sandi Mariscal M.D.   On: 09/29/2019 08:59    Anti-infectives: Anti-infectives (From admission, onward)   Start     Dose/Rate Route Frequency Ordered Stop   09/28/19 1030  cefTRIAXone (ROCEPHIN) 1 g in sodium chloride 0.9 % 100 mL IVPB     1 g 200 mL/hr over 30 Minutes Intravenous Every 24 hours 09/28/19 1016        Assessment/Plan: Ms. Sprung is a 53 yo with gallstone pancreatitis who is overall improving. ERCP yesterday with sphincterotomy. Improving. Leukocytosis, LFTs and lipase improved.  -Could start clear liquids and monitor patient for worsening pain or symptoms  -Plan for Lap chole Tuesday 11/24 once pancreatitis improved further   LOS: 2 days    Virl Cagey 09/30/2019

## 2019-09-30 NOTE — Progress Notes (Signed)
PROGRESS NOTE    Donna Vega  [id number] DOB: 1966/02/19 DOA: 09/28/2019 PCP: Donna Fantasia, NP   Brief Narrative:  Per HPI: [name] a51 y.o.femalew depression, gerd, apparently has had intermittent abdominal pain for the past 1 month. Over the past 2 days the pain has worsened. Epgiastric, sharp pain, Associated with n/v, but no bloody emesis. Pt denies fever, chills, cough, cp, palp, sob, diarrhea, brbpr, black stool, dysuria, hematuria.  Patient was noted to have biliary pancreatitis noted on CT abdomen and pelvis along with lipase elevation of 3832. She has been seen by GI today with plans to keep n.p.o. with aggressive fluid resuscitation.  11/20: Patient has undergone ERCP this morning with biliary sphincterotomy and no common bile duct stone noted.  GI recommends to stay off heparin agents for the next 72 hours.  General surgery consulted for cholecystectomy once pancreatitis resolves.  Appreciate further recommendations.  She is to remain n.p.o. with ongoing IV fluid as she is still quite symptomatic.  LFTs and lipase is noted to be downtrending.  Will repeat labs in a.m.  11/21: Patient appears to be feeling better this morning with less abdominal pain and nausea.  We will plan to advance diet to clear liquids and decrease IV fluid rate to 125 cc/h.  Appreciate further GI recommendations.  General surgery planning for laparoscopic cholecystectomy on 11/24.  Assessment & Plan:   Principal Problem:   Acute gallstone pancreatitis Active Problems:   Pancreatitis   Abnormal liver function   Depression   GERD (gastroesophageal reflux disease)   Biliary pancreatitis status post ERCP with sphincterotomy-improving -Continue on aggressive IV fluid, but at decreased rate of 125 cc/h today -Start clear liquid diet -Rocephin as prescribed for intra-abdominal coverage -Appreciate general surgery consultation for cholecystectomy once pancreatitis resolves  -Zofran as needed for nausea vomiting -Dilaudid for pain management -Appreciate GI recommendations  Depression -Resume home Paxil  GERD -Protonix 40 mg IV daily   DVT prophylaxis: SCDs Code Status: Full Family Communication: Discussed with husband, Donna Vega at bedside on 11/20 Disposition Plan: Continue close monitoring with repeat labs in a.m. and maintain on IV fluid.  Advance diet to clear liquid.  Anticipate laparoscopic cholecystectomy on 11/24.   Consultants:   GI  General surgery  Procedures:   None  Antimicrobials:  Anti-infectives (From admission, onward)   Start     Dose/Rate Route Frequency Ordered Stop   09/28/19 1030  cefTRIAXone (ROCEPHIN) 1 g in sodium chloride 0.9 % 100 mL IVPB     1 g 200 mL/hr over 30 Minutes Intravenous Every 24 hours 09/28/19 1016         Subjective: Patient seen and evaluated today with no new acute complaints or concerns. No acute concerns or events noted overnight.  She denies any further abdominal pain or nausea and would like to try clears.  Objective: Vitals:   09/29/19 1348 09/29/19 1859 09/29/19 2102 09/30/19 0601  BP: 131/67 112/68 121/72 115/69  Pulse: 85 81 90 89  Resp: 18 20 20 20   Temp: 98.4 F (36.9 C) 99.1 F (37.3 C) 98.6 F (37 C) 98.5 F (36.9 C)  TempSrc: Oral Oral Oral Oral  SpO2: 97% 94% 96% 92%  Weight:    104.6 kg  Height:       No intake or output data in the 24 hours ending 09/30/19 1010 Filed Weights   09/28/19 0222 09/28/19 2140 09/30/19 0601  Weight: 96.6 kg 98.1 kg 104.6 kg    Examination:  General  exam: Appears calm and comfortable  Respiratory system: Clear to auscultation. Respiratory effort normal. Cardiovascular system: S1 & S2 heard, RRR. No JVD, murmurs, rubs, gallops or clicks. No pedal edema. Gastrointestinal system: Abdomen is nondistended, soft and nontender. No organomegaly or masses felt. Normal bowel sounds heard. Central nervous system: Alert and oriented. No  focal neurological deficits. Extremities: Symmetric 5 x 5 power. Skin: No rashes, lesions or ulcers Psychiatry: Judgement and insight appear normal. Mood & affect appropriate.     Data Reviewed: I have personally reviewed following labs and imaging studies  CBC: Recent Labs  Lab 09/28/19 0253 09/28/19 1152 09/29/19 0507 09/30/19 0740  WBC 12.2* 17.2* 18.5* 15.9*  NEUTROABS 11.1*  --   --   --   HGB 16.1* 16.0* 14.4 13.1  HCT 50.0* 50.0* 44.8 41.2  MCV 92.6 93.6 94.3 96.3  PLT 212 195 204 [id number]   Basic Metabolic Panel: Recent Labs  Lab 09/28/19 0253 09/28/19 1152 09/29/19 0507 09/30/19 0740  NA 137 141 138 134*  K 3.9 4.2 3.6 3.6  CL 100 102 105 104  CO2 26 27 23  21*  GLUCOSE 175* 132* 108* 91  BUN 20 19 19 10   CREATININE 0.84 0.74 0.63 0.61  CALCIUM 9.1 8.7* 7.9* 7.9*   GFR: Estimated Creatinine Clearance: 105.3 mL/min (by C-G formula based on SCr of 0.61 mg/dL). Liver Function Tests: Recent Labs  Lab 09/28/19 0253 09/29/19 0507 09/30/19 0740  AST 330* 106* 36  ALT 468* 257* 128*  ALKPHOS 540* 371* 236*  BILITOT 5.8* 2.4* 1.5*  PROT 8.4* 6.2* 5.7*  ALBUMIN 4.2 2.9* 2.5*   Recent Labs  Lab 09/27/19 1500 09/28/19 0253 09/29/19 0507 09/30/19 0740  LIPASE >6,000* 3,832* 1,159* 117*   No results for input(s): AMMONIA in the last 168 hours. Coagulation Profile: No results for input(s): INR, PROTIME in the last 168 hours. Cardiac Enzymes: No results for input(s): CKTOTAL, CKMB, CKMBINDEX, TROPONINI in the last 168 hours. BNP (last 3 results) No results for input(s): PROBNP in the last 8760 hours. HbA1C: No results for input(s): HGBA1C in the last 72 hours. CBG: No results for input(s): GLUCAP in the last 168 hours. Lipid Profile: Recent Labs    09/28/19 0253  CHOL 265*  HDL 82  LDLCALC 174*  TRIG 45  CHOLHDL 3.2   Thyroid Function Tests: No results for input(s): TSH, T4TOTAL, FREET4, T3FREE, THYROIDAB in the last 72 hours. Anemia Panel: No  results for input(s): VITAMINB12, FOLATE, FERRITIN, TIBC, IRON, RETICCTPCT in the last 72 hours. Sepsis Labs: No results for input(s): PROCALCITON, LATICACIDVEN in the last 168 hours.  Recent Results (from the past 240 hour(s))  SARS Coronavirus 2 by RT PCR (hospital order, performed in [facility] hospital lab) Nasopharyngeal Nasopharyngeal Swab     Status: None   Collection Time: 09/28/19  6:30 AM   Specimen: Nasopharyngeal Swab  Result Value Ref Range Status   SARS Coronavirus 2 NEGATIVE NEGATIVE Final    Comment: (NOTE) If result is NEGATIVE SARS-CoV-2 target nucleic acids are NOT DETECTED. The SARS-CoV-2 RNA is generally detectable in upper and lower  respiratory specimens during the acute phase of infection. The lowest  concentration of SARS-CoV-2 viral copies this assay can detect is 250  copies / mL. A negative result does not preclude SARS-CoV-2 infection  and should not be used as the sole basis for treatment or other  patient management decisions.  A negative result may occur with  improper specimen collection / handling, submission of  specimen other  than nasopharyngeal swab, presence of viral mutation(s) within the  areas targeted by this assay, and inadequate number of viral copies  (<250 copies / mL). A negative result must be combined with clinical  observations, patient history, and epidemiological information. If result is POSITIVE SARS-CoV-2 target nucleic acids are DETECTED. The SARS-CoV-2 RNA is generally detectable in upper and lower  respiratory specimens dur ing the acute phase of infection.  Positive  results are indicative of active infection with SARS-CoV-2.  Clinical  correlation with patient history and other diagnostic information is  necessary to determine patient infection status.  Positive results do  not rule out bacterial infection or co-infection with other viruses. If result is PRESUMPTIVE POSTIVE SARS-CoV-2 nucleic acids MAY BE PRESENT.   A  presumptive positive result was obtained on the submitted specimen  and confirmed on repeat testing.  While 2019 novel coronavirus  (SARS-CoV-2) nucleic acids may be present in the submitted sample  additional confirmatory testing may be necessary for epidemiological  and / or clinical management purposes  to differentiate between  SARS-CoV-2 and other Sarbecovirus currently known to infect humans.  If clinically indicated additional testing with an alternate test  methodology [phone]) is advised. The SARS-CoV-2 RNA is generally  detectable in upper and lower respiratory sp ecimens during the acute  phase of infection. The expected result is Negative. Fact Sheet for Patients:  StrictlyIdeas.no Fact Sheet for Healthcare Providers: BankingDealers.co.za This test is not yet approved or cleared by the [place] FDA and has been authorized for detection and/or diagnosis of SARS-CoV-2 by FDA under an Emergency Use Authorization (EUA).  This EUA will remain in effect (meaning this test can be used) for the duration of the COVID-19 declaration under Section 564(b)(1) of the Act, 21 U.S.C. section 360bbb-3(b)(1), unless the authorization is terminated or revoked sooner. Performed at [facility], [address] [place], [place] [address]          Radiology Studies: Dg Abd 1 View  Result Date: 09/29/2019 CLINICAL DATA:  Abdominal pain. Gastroesophageal reflux disease. Melanoma. EXAM: ABDOMEN - 1 VIEW COMPARISON:  CT of 1 day prior FINDINGS: Two supine views. Gas-filled, non dilated large and small bowel loops. No free intraperitoneal air. Contrast within the colon. IMPRESSION: No acute findings. Electronically Signed   By: Abigail Miyamoto M.D.   On: 09/29/2019 20:36   Dg Ercp Biliary & Pancreatic Ducts  Result Date: 09/29/2019 CLINICAL DATA:  Biliary pancreatitis with calcified stones within the CBD. EXAM: ERCP TECHNIQUE: Multiple spot  images obtained with the fluoroscopic device and submitted for interpretation post-procedure. COMPARISON:  CT abdomen and pelvis - 09/28/2019 FLUOROSCOPY TIME:  32 seconds FINDINGS: 4 spot intraoperative fluoroscopic image of the right upper abdominal quadrant are provided for review. Initial image demonstrates an ERCP probe over the right upper abdominal quadrant. Subsequent images demonstrate selective cannulation and opacification of the common bile duct which appears mildly dilated. There is opacification of the cystic duct with minimal opacification the neck of the gallbladder. There is no significant opacification intrahepatic biliary system or the pancreatic duct. IMPRESSION: ERCP as above. These images were submitted for radiologic interpretation only. Please see the procedural report for the amount of contrast and the fluoroscopy time utilized. Electronically Signed   By: Sandi Mariscal M.D.   On: 09/29/2019 08:59        Scheduled Meds: . pantoprazole (PROTONIX) IV  40 mg Intravenous Q24H   Continuous Infusions: . cefTRIAXone (ROCEPHIN)  IV 1 g (  09/29/19 1057)  . lactated ringers 250 mL/hr at 09/30/19 0344     LOS: 2 days    Time spent: 30 minutes    Sena Clouatre Darleen Crocker, DO [facility] Pager [phone]  If 7PM-7AM, please contact night-coverage [email or web address] Password [email or web address] 09/30/2019, 10:10 AM

## 2019-10-01 ENCOUNTER — Inpatient Hospital Stay (HOSPITAL_COMMUNITY): Payer: 59

## 2019-10-01 LAB — COMPREHENSIVE METABOLIC PANEL
ALT: 92 U/L — ABNORMAL HIGH (ref 0–44)
AST: 26 U/L (ref 15–41)
Albumin: 2.5 g/dL — ABNORMAL LOW (ref 3.5–5.0)
Alkaline Phosphatase: 212 U/L — ABNORMAL HIGH (ref 38–126)
Anion gap: 8 (ref 5–15)
BUN: 7 mg/dL (ref 6–20)
CO2: 26 mmol/L (ref 22–32)
Calcium: 8 mg/dL — ABNORMAL LOW (ref 8.9–10.3)
Chloride: 100 mmol/L (ref 98–111)
Creatinine, Ser: 0.57 mg/dL (ref 0.44–1.00)
GFR calc Af Amer: >60 mL/min (ref >60)
GFR calc non Af Amer: >60 mL/min (ref >60)
Glucose, Bld: 90 mg/dL (ref 70–99)
Potassium: 3.2 mmol/L — ABNORMAL LOW (ref 3.5–5.1)
Sodium: 134 mmol/L — ABNORMAL LOW (ref 135–145)
Total Bilirubin: 1.4 mg/dL — ABNORMAL HIGH (ref 0.3–1.2)
Total Protein: 6.3 g/dL — ABNORMAL LOW (ref 6.5–8.1)

## 2019-10-01 LAB — CBC
HCT: 39 % (ref 36.0–46.0)
Hemoglobin: 12.3 g/dL (ref 12.0–15.0)
MCH: 29.7 pg (ref 26.0–34.0)
MCHC: 31.5 g/dL (ref 30.0–36.0)
MCV: 94.2 fL (ref 80.0–100.0)
Platelets: 174 10*3/uL (ref 150–400)
RBC: 4.14 MIL/uL (ref 3.87–5.11)
RDW: 12.8 % (ref 11.5–15.5)
WBC: 14.3 10*3/uL — ABNORMAL HIGH (ref 4.0–10.5)
nRBC: 0 % (ref 0.0–0.2)

## 2019-10-01 LAB — LIPASE, BLOOD: Lipase: 52 U/L — ABNORMAL HIGH (ref 11–51)

## 2019-10-01 MED ORDER — POTASSIUM CHLORIDE CRYS ER 20 MEQ PO TBCR
40.0000 meq | EXTENDED_RELEASE_TABLET | Freq: Once | ORAL | Status: AC
  Administered 2019-10-01: 40 meq via ORAL
  Filled 2019-10-01: qty 2

## 2019-10-01 MED ORDER — SODIUM CHLORIDE 0.9 % IV SOLN
INTRAVENOUS | Status: DC
  Administered 2019-10-01: 13:00:00 via INTRAVENOUS

## 2019-10-01 NOTE — Progress Notes (Signed)
[facility] Progress Note  2 Days Post-Op  Subjective: Says her pain was worse with clears and felt nausea/bloating. Does not want to do any further today. Only wants water. Remains tender in the epigastric region.   Objective: Vital signs in last 24 hours: Temp:  [97.7 F (36.5 C)-98 F (36.7 C)] 97.7 F (36.5 C) (11/22 0555) Pulse Rate:  [75-86] 86 (11/22 0555) Resp:  [18-20] 18 (11/22 0555) BP: (122-135)/(69-82) 135/82 (11/22 0555) SpO2:  [93 %-95 %] 95 % (11/22 0555) Weight:  [104.7 kg] 104.7 kg (11/22 0555) Last BM Date: 09/27/19  Intake/Output from previous day: 11/21 0701 - 11/22 0700 In: 600 [P.O.:600] Out: 1000 [Urine:1000] Intake/Output this shift: No intake/output data recorded.  General appearance: alert, cooperative and no distress Resp: normal work of breathing GI: soft, distended, epigastric tenderness  Lab Results:  Recent Labs    09/30/19 0740 10/01/19 0704  WBC 15.9* 14.3*  HGB 13.1 12.3  HCT 41.2 39.0  PLT 123* 174   BMET Recent Labs    09/30/19 0740 10/01/19 0704  NA 134* 134*  K 3.6 3.2*  CL 104 100  CO2 21* 26  GLUCOSE 91 90  BUN 10 7  CREATININE 0.61 0.57  CALCIUM 7.9* 8.0*   PT/INR No results for input(s): LABPROT, INR in the last 72 hours.  Studies/Results: Dg Abd 1 View  Result Date: 09/29/2019 CLINICAL DATA:  Abdominal pain. Gastroesophageal reflux disease. Melanoma. EXAM: ABDOMEN - 1 VIEW COMPARISON:  CT of 1 day prior FINDINGS: Two supine views. Gas-filled, non dilated large and small bowel loops. No free intraperitoneal air. Contrast within the colon. IMPRESSION: No acute findings. Electronically Signed   By: Abigail Miyamoto M.D.   On: 09/29/2019 20:36    Anti-infectives: Anti-infectives (From admission, onward)   Start     Dose/Rate Route Frequency Ordered Stop   09/28/19 1030  cefTRIAXone (ROCEPHIN) 1 g in sodium chloride 0.9 % 100 mL IVPB     1 g 200 mL/hr over 30 Minutes Intravenous Every 24 hours  09/28/19 1016        Assessment/Plan: Ms. Bartunek is a 53 yo with gallstone pancreatitis. Improving with labs but her exam and ability to tolerate po make me worry about the degree of inflammation related to her pancreatitis/ pseudocyst formation or other evolution.  -Diet as tolerated -On schedule for Tuesday, but if pain is not improving and is unable to tolerate diet, she may need to have her surgery delayed a few weeks to allow for inflammation to die down further  -May need repeat CT if no improvement tomorrow to see the evolution of the pancreatitis    LOS: 3 days    Virl Cagey 10/01/2019

## 2019-10-01 NOTE — Progress Notes (Signed)
PROGRESS NOTE    Donna Vega  [id number] DOB: 20-Jan-1966 DOA: 09/28/2019 PCP: Tylene Fantasia, NP   Brief Narrative:  Per HPI: [name] a55 y.o.femalew depression, gerd, apparently has had intermittent abdominal pain for the past 1 month. Over the past 2 days the pain has worsened. Epgiastric, sharp pain, Associated with n/v, but no bloody emesis. Pt denies fever, chills, cough, cp, palp, sob, diarrhea, brbpr, black stool, dysuria, hematuria.  Patient was noted to have biliary pancreatitis noted on CT abdomen and pelvis along with lipase elevation of 3832. She has been seen by GI today with plans to keep n.p.o. with aggressive fluid resuscitation.  11/20:Patient has undergone ERCP this morning with biliary sphincterotomy and no common bile duct stone noted. GI recommends to stay off heparin agents for the next 72 hours. General surgery consulted for cholecystectomy once pancreatitis resolves. Appreciate further recommendations. She is to remain n.p.o. with ongoing IV fluid as she is still quite symptomatic. LFTs and lipase is noted to be downtrending. Will repeat labs in a.m.  11/21: Patient appears to be feeling better this morning with less abdominal pain and nausea.  We will plan to advance diet to clear liquids and decrease IV fluid rate to 125 cc/h.  Appreciate further GI recommendations.  General surgery planning for laparoscopic cholecystectomy on 11/24.  11/22: Patient continues to have some intermittent abdominal pain, but no nausea or vomiting.  She has some hypokalemia which will be repleted.  Plan to recheck labs in a.m.  Assessment & Plan:   Principal Problem:   Acute gallstone pancreatitis Active Problems:   Pancreatitis   Abnormal liver function   Depression   GERD (gastroesophageal reflux disease)   Biliary pancreatitis status post ERCP with sphincterotomy-improving -Continue IV fluid with normal saline due to mild hyponatremia  -Continue clear liquid diet -Rocephin as prescribed for intra-abdominal coverage -Appreciate general surgery consultation for cholecystectomy once pancreatitis resolves -Zofran as needed for nausea vomiting -Dilaudid for pain management -Appreciate GI recommendations  Depression -Resume home Paxil  GERD -Protonix 40 mg IV daily   DVT prophylaxis:SCDs Code Status:Full Family Communication:Discussed with husband, [name] at bedside on 11/20 Disposition Plan:Continue close monitoring with repeat labs in a.m. and maintain on IV fluid.  Continue clear liquid diet.  Anticipate laparoscopic cholecystectomy on 11/24.   Consultants:  GI  General surgery  Procedures:  None   Antimicrobials:  Anti-infectives (From admission, onward)   Start     Dose/Rate Route Frequency Ordered Stop   09/28/19 1030  cefTRIAXone (ROCEPHIN) 1 g in sodium chloride 0.9 % 100 mL IVPB     1 g 200 mL/hr over 30 Minutes Intravenous Every 24 hours 09/28/19 1016         Subjective: Patient seen and evaluated today with some ongoing and intermittent abdominal pain.  No nausea or vomiting noted.  Tolerating clears.  Objective: Vitals:   09/29/19 2102 09/30/19 0601 09/30/19 2132 10/01/19 0555  BP: 121/72 115/69 122/69 135/82  Pulse: 90 89 75 86  Resp: 20 20 20 18   Temp: 98.6 F (37 C) 98.5 F (36.9 C) 98 F (36.7 C) 97.7 F (36.5 C)  TempSrc: Oral Oral Oral Oral  SpO2: 96% 92% 93% 95%  Weight:  104.6 kg  104.7 kg  Height:        Intake/Output Summary (Last 24 hours) at 10/01/2019 1132 Last data filed at 10/01/2019 0600 Gross per 24 hour  Intake 360 ml  Output 1000 ml  Net -640 ml  Filed Weights   09/28/19 2140 09/30/19 0601 10/01/19 0555  Weight: 98.1 kg 104.6 kg 104.7 kg    Examination:  General exam: Appears calm and comfortable  Respiratory system: Clear to auscultation. Respiratory effort normal. Cardiovascular system: S1 & S2 heard, RRR. No JVD, murmurs, rubs,  gallops or clicks. No pedal edema. Gastrointestinal system: Abdomen is nondistended, soft and nontender. No organomegaly or masses felt. Normal bowel sounds heard. Central nervous system: Alert and oriented. No focal neurological deficits. Extremities: Symmetric 5 x 5 power. Skin: No rashes, lesions or ulcers Psychiatry: Judgement and insight appear normal. Mood & affect appropriate.     Data Reviewed: I have personally reviewed following labs and imaging studies  CBC: Recent Labs  Lab 09/28/19 0253 09/28/19 1152 09/29/19 0507 09/30/19 0740 10/01/19 0704  WBC 12.2* 17.2* 18.5* 15.9* 14.3*  NEUTROABS 11.1*  --   --   --   --   HGB 16.1* 16.0* 14.4 13.1 12.3  HCT 50.0* 50.0* 44.8 41.2 39.0  MCV 92.6 93.6 94.3 96.3 94.2  PLT 212 195 204 123* [id number]   Basic Metabolic Panel: Recent Labs  Lab 09/28/19 0253 09/28/19 1152 09/29/19 0507 09/30/19 0740 10/01/19 0704  NA 137 141 138 134* 134*  K 3.9 4.2 3.6 3.6 3.2*  CL 100 102 105 104 100  CO2 26 27 23  21* 26  GLUCOSE 175* 132* 108* 91 90  BUN 20 19 19 10 7   CREATININE 0.84 0.74 0.63 0.61 0.57  CALCIUM 9.1 8.7* 7.9* 7.9* 8.0*   GFR: Estimated Creatinine Clearance: 105.3 mL/min (by C-G formula based on SCr of 0.57 mg/dL). Liver Function Tests: Recent Labs  Lab 09/28/19 0253 09/29/19 0507 09/30/19 0740 10/01/19 0704  AST 330* 106* 36 26  ALT 468* 257* 128* 92*  ALKPHOS 540* 371* 236* 212*  BILITOT 5.8* 2.4* 1.5* 1.4*  PROT 8.4* 6.2* 5.7* 6.3*  ALBUMIN 4.2 2.9* 2.5* 2.5*   Recent Labs  Lab 09/27/19 1500 09/28/19 0253 09/29/19 0507 09/30/19 0740 10/01/19 0704  LIPASE >6,000* 3,832* 1,159* 117* 52*   No results for input(s): AMMONIA in the last 168 hours. Coagulation Profile: No results for input(s): INR, PROTIME in the last 168 hours. Cardiac Enzymes: No results for input(s): CKTOTAL, CKMB, CKMBINDEX, TROPONINI in the last 168 hours. BNP (last 3 results) No results for input(s): PROBNP in the last 8760 hours.  HbA1C: No results for input(s): HGBA1C in the last 72 hours. CBG: No results for input(s): GLUCAP in the last 168 hours. Lipid Profile: No results for input(s): CHOL, HDL, LDLCALC, TRIG, CHOLHDL, LDLDIRECT in the last 72 hours. Thyroid Function Tests: No results for input(s): TSH, T4TOTAL, FREET4, T3FREE, THYROIDAB in the last 72 hours. Anemia Panel: No results for input(s): VITAMINB12, FOLATE, FERRITIN, TIBC, IRON, RETICCTPCT in the last 72 hours. Sepsis Labs: No results for input(s): PROCALCITON, LATICACIDVEN in the last 168 hours.  Recent Results (from the past 240 hour(s))  SARS Coronavirus 2 by RT PCR (hospital order, performed in [facility] hospital lab) Nasopharyngeal Nasopharyngeal Swab     Status: None   Collection Time: 09/28/19  6:30 AM   Specimen: Nasopharyngeal Swab  Result Value Ref Range Status   SARS Coronavirus 2 NEGATIVE NEGATIVE Final    Comment: (NOTE) If result is NEGATIVE SARS-CoV-2 target nucleic acids are NOT DETECTED. The SARS-CoV-2 RNA is generally detectable in upper and lower  respiratory specimens during the acute phase of infection. The lowest  concentration of SARS-CoV-2 viral copies this assay can detect is  250  copies / mL. A negative result does not preclude SARS-CoV-2 infection  and should not be used as the sole basis for treatment or other  patient management decisions.  A negative result may occur with  improper specimen collection / handling, submission of specimen other  than nasopharyngeal swab, presence of viral mutation(s) within the  areas targeted by this assay, and inadequate number of viral copies  (<250 copies / mL). A negative result must be combined with clinical  observations, patient history, and epidemiological information. If result is POSITIVE SARS-CoV-2 target nucleic acids are DETECTED. The SARS-CoV-2 RNA is generally detectable in upper and lower  respiratory specimens dur ing the acute phase of infection.  Positive   results are indicative of active infection with SARS-CoV-2.  Clinical  correlation with patient history and other diagnostic information is  necessary to determine patient infection status.  Positive results do  not rule out bacterial infection or co-infection with other viruses. If result is PRESUMPTIVE POSTIVE SARS-CoV-2 nucleic acids MAY BE PRESENT.   A presumptive positive result was obtained on the submitted specimen  and confirmed on repeat testing.  While 2019 novel coronavirus  (SARS-CoV-2) nucleic acids may be present in the submitted sample  additional confirmatory testing may be necessary for epidemiological  and / or clinical management purposes  to differentiate between  SARS-CoV-2 and other Sarbecovirus currently known to infect humans.  If clinically indicated additional testing with an alternate test  methodology [phone]) is advised. The SARS-CoV-2 RNA is generally  detectable in upper and lower respiratory sp ecimens during the acute  phase of infection. The expected result is Negative. Fact Sheet for Patients:  StrictlyIdeas.no Fact Sheet for Healthcare Providers: BankingDealers.co.za This test is not yet approved or cleared by the [place] FDA and has been authorized for detection and/or diagnosis of SARS-CoV-2 by FDA under an Emergency Use Authorization (EUA).  This EUA will remain in effect (meaning this test can be used) for the duration of the COVID-19 declaration under Section 564(b)(1) of the Act, 21 U.S.C. section 360bbb-3(b)(1), unless the authorization is terminated or revoked sooner. Performed at [facility], [address] [place], [place] [address]          Radiology Studies: Dg Abd 1 View  Result Date: 09/29/2019 CLINICAL DATA:  Abdominal pain. Gastroesophageal reflux disease. Melanoma. EXAM: ABDOMEN - 1 VIEW COMPARISON:  CT of 1 day prior FINDINGS: Two supine views. Gas-filled, non dilated  large and small bowel loops. No free intraperitoneal air. Contrast within the colon. IMPRESSION: No acute findings. Electronically Signed   By: Abigail Miyamoto M.D.   On: 09/29/2019 20:36        Scheduled Meds: . pantoprazole  40 mg Oral QAC breakfast  . PARoxetine  20 mg Oral Daily  . potassium chloride  40 mEq Oral Once   Continuous Infusions: . sodium chloride    . cefTRIAXone (ROCEPHIN)  IV 1 g (10/01/19 0904)     LOS: 3 days    Time spent: 30 minutes    Pratik Darleen Crocker, DO [facility] Pager [phone]  If 7PM-7AM, please contact night-coverage [email or web address] Password [facility] 10/01/2019, 11:32 AM

## 2019-10-02 ENCOUNTER — Encounter (HOSPITAL_COMMUNITY): Payer: Self-pay | Admitting: Internal Medicine

## 2019-10-02 ENCOUNTER — Telehealth: Payer: Self-pay

## 2019-10-02 LAB — COMPREHENSIVE METABOLIC PANEL
ALT: 63 U/L — ABNORMAL HIGH (ref 0–44)
AST: 23 U/L (ref 15–41)
Albumin: 2.3 g/dL — ABNORMAL LOW (ref 3.5–5.0)
Alkaline Phosphatase: 182 U/L — ABNORMAL HIGH (ref 38–126)
Anion gap: 9 (ref 5–15)
BUN: 5 mg/dL — ABNORMAL LOW (ref 6–20)
CO2: 26 mmol/L (ref 22–32)
Calcium: 8 mg/dL — ABNORMAL LOW (ref 8.9–10.3)
Chloride: 100 mmol/L (ref 98–111)
Creatinine, Ser: 0.59 mg/dL (ref 0.44–1.00)
GFR calc Af Amer: >60 mL/min (ref >60)
GFR calc non Af Amer: >60 mL/min (ref >60)
Glucose, Bld: 102 mg/dL — ABNORMAL HIGH (ref 70–99)
Potassium: 4 mmol/L (ref 3.5–5.1)
Sodium: 135 mmol/L (ref 135–145)
Total Bilirubin: 1 mg/dL (ref 0.3–1.2)
Total Protein: 6 g/dL — ABNORMAL LOW (ref 6.5–8.1)

## 2019-10-02 LAB — LIPASE, BLOOD: Lipase: 36 U/L (ref 11–51)

## 2019-10-02 LAB — MAGNESIUM: Magnesium: 2.3 mg/dL (ref 1.7–2.4)

## 2019-10-02 MED ORDER — IPRATROPIUM-ALBUTEROL 0.5-2.5 (3) MG/3ML IN SOLN: 3.0000 mL | Freq: Four times a day (QID) | RESPIRATORY_TRACT | Status: DC | PRN

## 2019-10-02 NOTE — Plan of Care (Signed)
  Problem: Clinical Measurements: Goal: Respiratory complications will improve Outcome: Progressing   Problem: Activity: Goal: Risk for activity intolerance will decrease Outcome: Progressing   

## 2019-10-02 NOTE — Telephone Encounter (Signed)
I received a VM to call [facility] for urgent lab results. Ref # [id number] I called and spoke to Quillian Quince and he gave me the Lipase results that was collected on 09/27/2019 of greater than 6000. I told him the pt just had it repeated this morning and it is 52 but I will make the provider aware. Forwarding to Roseanne Kaufman, NP who ordered the test.

## 2019-10-02 NOTE — Telephone Encounter (Signed)
Noted  

## 2019-10-02 NOTE — Telephone Encounter (Signed)
Patient is inpatient since last week. Those labs resulted after she had already been admitted.

## 2019-10-02 NOTE — Progress Notes (Addendum)
[facility] Progress Note  3 Days Post-Op  Subjective: Doing fair but still with significant pain. Only taking in water and some juice. Bloated.   Objective: Vital signs in last 24 hours: Temp:  [98.1 F (36.7 C)-98.2 F (36.8 C)] 98.2 F (36.8 C) (11/23 0800) Pulse Rate:  [85-95] 95 (11/23 0800) Resp:  [18] 18 (11/23 0428) BP: (130-138)/(72-78) 137/72 (11/23 0800) SpO2:  [92 %-96 %] 95 % (11/23 0800) Last BM Date: 09/27/19  Intake/Output from previous day: 11/22 0701 - 11/23 0700 In: 1398.5 [P.O.:840; I.V.:273.4; IV Piggyback:285.1] Out: 1850 [Urine:1850] Intake/Output this shift: No intake/output data recorded.  General appearance: alert, cooperative and no distress Resp: normal work of breathing GI: soft, distended, tender epigastric area  Lab Results:  Recent Labs    09/30/19 0740 10/01/19 0704  WBC 15.9* 14.3*  HGB 13.1 12.3  HCT 41.2 39.0  PLT 123* 174   BMET Recent Labs    10/01/19 0704 10/02/19 0635  NA 134* 135  K 3.2* 4.0  CL 100 100  CO2 26 26  GLUCOSE 90 102*  BUN 7 5*  CREATININE 0.57 0.59  CALCIUM 8.0* 8.0*   PT/INR No results for input(s): LABPROT, INR in the last 72 hours.  Studies/Results: Dg Chest Port 1 View  Result Date: 10/01/2019 CLINICAL DATA:  Dyspnea. EXAM: PORTABLE CHEST 1 VIEW COMPARISON:  07/29/2012 FINDINGS: The lung volumes are low. The heart size is mildly enlarged. There is a small to moderate-sized left-sided pleural effusion. There is a left-sided airspace opacity favored to represent atelectasis. There is no displaced fracture. IMPRESSION: 1. Small to moderate-sized left-sided pleural effusion. 2. Left-sided airspace opacity favored to represent atelectasis. 3. Mild cardiomegaly. Electronically Signed   By: Constance Holster M.D.   On: 10/01/2019 16:55    Anti-infectives: Anti-infectives (From admission, onward)   Start     Dose/Rate Route Frequency Ordered Stop   09/28/19 1030  cefTRIAXone  (ROCEPHIN) 1 g in sodium chloride 0.9 % 100 mL IVPB     1 g 200 mL/hr over 30 Minutes Intravenous Every 24 hours 09/28/19 1016        Assessment/Plan: Ms. Hudlow is a patient with gallstone pancreatitis. She is slowly improving but she still has significant pain and tenderness. We had her on the schedule for cholecystectomy tomorrow, but given the pain. I think it is probably best to wait a few more days.  Will put on the schedule for Wednesday. I ordered a repeat CT for tomorrow (5 days out from prior), to assess for changes/ inflammation/ pseudocyst formation.  If there is still significant inflammation we may need to hold on surgery this admission and plan for close follow up with gallbladder out in the next week or two.   CBC with differential tomorrow am  Updated patient and Dr. Manuella Ghazi.   Curlene Labrum, MD [facility] [address] [place], [place] [address] [phone] (office)    LOS: 4 days    Virl Cagey 10/02/2019

## 2019-10-02 NOTE — Progress Notes (Signed)
PROGRESS NOTE    Donna Vega  [id number] DOB: 1966-09-03 DOA: 09/28/2019 PCP: Tylene Fantasia, NP   Brief Narrative:  Per HPI: [name] a33 y.o.femalew depression, gerd, apparently has had intermittent abdominal pain for the past 1 month. Over the past 2 days the pain has worsened. Epgiastric, sharp pain, Associated with n/v, but no bloody emesis. Pt denies fever, chills, cough, cp, palp, sob, diarrhea, brbpr, black stool, dysuria, hematuria.  Patient was noted to have biliary pancreatitis noted on CT abdomen and pelvis along with lipase elevation of 3832. She has been seen by GI today with plans to keep n.p.o. with aggressive fluid resuscitation.  11/20:Patient has undergone ERCP this morning with biliary sphincterotomy and no common bile duct stone noted. GI recommends to stay off heparin agents for the next 72 hours. General surgery consulted for cholecystectomy once pancreatitis resolves. Appreciate further recommendations. She is to remain n.p.o. with ongoing IV fluid as she is still quite symptomatic. LFTs and lipase is noted to be downtrending. Will repeat labs in a.m.  11/21:Patient appears to be feeling better this morning with less abdominal pain and nausea. We will plan to advance diet to clear liquids and decrease IV fluid rate to 125 cc/h. Appreciate further GI recommendations. General surgery planning for laparoscopic cholecystectomy on 11/24.  11/22: Patient continues to have some intermittent abdominal pain, but no nausea or vomiting.  She has some hypokalemia which will be repleted.  Plan to recheck labs in a.m.  11/23: Patient continues to have ongoing abdominal pain that is intermittent in nature.  She appears to be tolerating some of her clear liquids.  IV fluid has been discontinued as a result of some expiratory wheezing and noted cardiomegaly with left sided pleural effusion.  She appears to have a component of volume overload at  this time.  LFTs are decreasing.  General surgery plans for repeat CT as well as CBC with differential in a.m. and may consider surgery by 11/25 if appropriate.  Assessment & Plan:   Principal Problem:   Acute gallstone pancreatitis Active Problems:   Pancreatitis   Abnormal liver function   Depression   GERD (gastroesophageal reflux disease)   Biliary pancreatitis status post ERCP with sphincterotomy-improving -DC further IV fluid due to volume overload and monitor a.m. labs -Continue clear liquid diet -Rocephin as prescribed for intra-abdominal coverage -Appreciate general surgery consultation for cholecystectomy once pancreatitis resolves -Zofran as needed for nausea vomiting -Dilaudid for pain management -Appreciate GI and general surgery recommendations -CT abdomen and pelvis for 11/24 planned  Depression -Resume home Paxil  GERD -Protonix 40 mg IV daily   DVT prophylaxis:SCDs Code Status:Full Family Communication:Discussed with husband, [name] at [employer] 11/20 Disposition Plan:Continue close monitoring and avoid further IV fluid given left-sided moderate pleural effusion as well as mild cardiomegaly.  Repeat labs in a.m. as well as CT abdomen tomorrow.   Consultants:  GI  General surgery  Procedures:  None  Antimicrobials:  Anti-infectives (From admission, onward)   Start     Dose/Rate Route Frequency Ordered Stop   09/28/19 1030  cefTRIAXone (ROCEPHIN) 1 g in sodium chloride 0.9 % 100 mL IVPB     1 g 200 mL/hr over 30 Minutes Intravenous Every 24 hours 09/28/19 1016         Subjective: Patient seen and evaluated today with complaints of ongoing, intermittent abdominal pain that is well controlled with pain medications.  She appears to be tolerating clear liquids.  Objective: Vitals:   10/01/19 1341  10/01/19 2111 10/02/19 0428 10/02/19 0800  BP: 138/78 130/78 135/75 137/72  Pulse: 87 90 85 95  Resp: 18  18   Temp: 98.1 F (36.7  C) 98.2 F (36.8 C) 98.2 F (36.8 C) 98.2 F (36.8 C)  TempSrc: Oral Oral  Oral  SpO2: 94% 96% 92% 95%  Weight:      Height:        Intake/Output Summary (Last 24 hours) at 10/02/2019 1114 Last data filed at 10/02/2019 0514 Gross per 24 hour  Intake 1158.5 ml  Output 1850 ml  Net -691.5 ml   Filed Weights   09/28/19 2140 09/30/19 0601 10/01/19 0555  Weight: 98.1 kg 104.6 kg 104.7 kg    Examination:  General exam: Appears calm and comfortable  Respiratory system: Clear to auscultation. Respiratory effort normal. Cardiovascular system: S1 & S2 heard, RRR. No JVD, murmurs, rubs, gallops or clicks. No pedal edema. Gastrointestinal system: Abdomen is nondistended, soft and nontender. No organomegaly or masses felt. Normal bowel sounds heard. Central nervous system: Alert and oriented. No focal neurological deficits. Extremities: Symmetric 5 x 5 power. Skin: No rashes, lesions or ulcers Psychiatry: Judgement and insight appear normal. Mood & affect appropriate.     Data Reviewed: I have personally reviewed following labs and imaging studies  CBC: Recent Labs  Lab 09/28/19 0253 09/28/19 1152 09/29/19 0507 09/30/19 0740 10/01/19 0704  WBC 12.2* 17.2* 18.5* 15.9* 14.3*  NEUTROABS 11.1*  --   --   --   --   HGB 16.1* 16.0* 14.4 13.1 12.3  HCT 50.0* 50.0* 44.8 41.2 39.0  MCV 92.6 93.6 94.3 96.3 94.2  PLT 212 195 204 123* [id number]   Basic Metabolic Panel: Recent Labs  Lab 09/28/19 1152 09/29/19 0507 09/30/19 0740 10/01/19 0704 10/02/19 0635  NA 141 138 134* 134* 135  K 4.2 3.6 3.6 3.2* 4.0  CL 102 105 104 100 100  CO2 27 23 21* 26 26  GLUCOSE 132* 108* 91 90 102*  BUN 19 19 10 7  5*  CREATININE 0.74 0.63 0.61 0.57 0.59  CALCIUM 8.7* 7.9* 7.9* 8.0* 8.0*  MG  --   --   --   --  2.3   GFR: Estimated Creatinine Clearance: 105.3 mL/min (by C-G formula based on SCr of 0.59 mg/dL). Liver Function Tests: Recent Labs  Lab 09/28/19 0253 09/29/19 0507 09/30/19 0740  10/01/19 0704 10/02/19 0635  AST 330* 106* 36 26 23  ALT 468* 257* 128* 92* 63*  ALKPHOS 540* 371* 236* 212* 182*  BILITOT 5.8* 2.4* 1.5* 1.4* 1.0  PROT 8.4* 6.2* 5.7* 6.3* 6.0*  ALBUMIN 4.2 2.9* 2.5* 2.5* 2.3*   Recent Labs  Lab 09/28/19 0253 09/29/19 0507 09/30/19 0740 10/01/19 0704 10/02/19 0635  LIPASE 3,832* 1,159* 117* 52* 36   No results for input(s): AMMONIA in the last 168 hours. Coagulation Profile: No results for input(s): INR, PROTIME in the last 168 hours. Cardiac Enzymes: No results for input(s): CKTOTAL, CKMB, CKMBINDEX, TROPONINI in the last 168 hours. BNP (last 3 results) No results for input(s): PROBNP in the last 8760 hours. HbA1C: No results for input(s): HGBA1C in the last 72 hours. CBG: No results for input(s): GLUCAP in the last 168 hours. Lipid Profile: No results for input(s): CHOL, HDL, LDLCALC, TRIG, CHOLHDL, LDLDIRECT in the last 72 hours. Thyroid Function Tests: No results for input(s): TSH, T4TOTAL, FREET4, T3FREE, THYROIDAB in the last 72 hours. Anemia Panel: No results for input(s): VITAMINB12, FOLATE, FERRITIN, TIBC, IRON, RETICCTPCT in  the last 72 hours. Sepsis Labs: No results for input(s): PROCALCITON, LATICACIDVEN in the last 168 hours.  Recent Results (from the past 240 hour(s))  SARS Coronavirus 2 by RT PCR (hospital order, performed in [facility] hospital lab) Nasopharyngeal Nasopharyngeal Swab     Status: None   Collection Time: 09/28/19  6:30 AM   Specimen: Nasopharyngeal Swab  Result Value Ref Range Status   SARS Coronavirus 2 NEGATIVE NEGATIVE Final    Comment: (NOTE) If result is NEGATIVE SARS-CoV-2 target nucleic acids are NOT DETECTED. The SARS-CoV-2 RNA is generally detectable in upper and lower  respiratory specimens during the acute phase of infection. The lowest  concentration of SARS-CoV-2 viral copies this assay can detect is 250  copies / mL. A negative result does not preclude SARS-CoV-2 infection  and should  not be used as the sole basis for treatment or other  patient management decisions.  A negative result may occur with  improper specimen collection / handling, submission of specimen other  than nasopharyngeal swab, presence of viral mutation(s) within the  areas targeted by this assay, and inadequate number of viral copies  (<250 copies / mL). A negative result must be combined with clinical  observations, patient history, and epidemiological information. If result is POSITIVE SARS-CoV-2 target nucleic acids are DETECTED. The SARS-CoV-2 RNA is generally detectable in upper and lower  respiratory specimens dur ing the acute phase of infection.  Positive  results are indicative of active infection with SARS-CoV-2.  Clinical  correlation with patient history and other diagnostic information is  necessary to determine patient infection status.  Positive results do  not rule out bacterial infection or co-infection with other viruses. If result is PRESUMPTIVE POSTIVE SARS-CoV-2 nucleic acids MAY BE PRESENT.   A presumptive positive result was obtained on the submitted specimen  and confirmed on repeat testing.  While 2019 novel coronavirus  (SARS-CoV-2) nucleic acids may be present in the submitted sample  additional confirmatory testing may be necessary for epidemiological  and / or clinical management purposes  to differentiate between  SARS-CoV-2 and other Sarbecovirus currently known to infect humans.  If clinically indicated additional testing with an alternate test  methodology [phone]) is advised. The SARS-CoV-2 RNA is generally  detectable in upper and lower respiratory sp ecimens during the acute  phase of infection. The expected result is Negative. Fact Sheet for Patients:  StrictlyIdeas.no Fact Sheet for Healthcare Providers: BankingDealers.co.za This test is not yet approved or cleared by the [place] FDA and has been  authorized for detection and/or diagnosis of SARS-CoV-2 by FDA under an Emergency Use Authorization (EUA).  This EUA will remain in effect (meaning this test can be used) for the duration of the COVID-19 declaration under Section 564(b)(1) of the Act, 21 U.S.C. section 360bbb-3(b)(1), unless the authorization is terminated or revoked sooner. Performed at [facility], [address], [place], [place] [address]          Radiology Studies: Dg Chest [facility] 1 View  Result Date: 10/01/2019 CLINICAL DATA:  Dyspnea. EXAM: PORTABLE CHEST 1 VIEW COMPARISON:  07/29/2012 FINDINGS: The lung volumes are low. The heart size is mildly enlarged. There is a small to moderate-sized left-sided pleural effusion. There is a left-sided airspace opacity favored to represent atelectasis. There is no displaced fracture. IMPRESSION: 1. Small to moderate-sized left-sided pleural effusion. 2. Left-sided airspace opacity favored to represent atelectasis. 3. Mild cardiomegaly. Electronically Signed   By: Constance Holster M.D.   On: 10/01/2019 16:55  Scheduled Meds: . pantoprazole  40 mg Oral QAC breakfast  . PARoxetine  20 mg Oral Daily   Continuous Infusions: . cefTRIAXone (ROCEPHIN)  IV 1 g (10/02/19 1026)     LOS: 4 days    Time spent: 30 minutes    Mersedes Alber Darleen Crocker, DO [facility] Pager [phone]  If 7PM-7AM, please contact night-coverage [email or web address] Password [email or web address] 10/02/2019, 11:14 AM

## 2019-10-03 ENCOUNTER — Encounter (HOSPITAL_COMMUNITY): Payer: Self-pay | Admitting: Anesthesiology

## 2019-10-03 ENCOUNTER — Inpatient Hospital Stay (HOSPITAL_COMMUNITY): Payer: 59

## 2019-10-03 LAB — CBC WITH DIFFERENTIAL/PLATELET
Abs Immature Granulocytes: 0.26 10*3/uL — ABNORMAL HIGH (ref 0.00–0.07)
Basophils Absolute: 0 10*3/uL (ref 0.0–0.1)
Basophils Relative: 0 %
Eosinophils Absolute: 0.1 10*3/uL (ref 0.0–0.5)
Eosinophils Relative: 1 %
HCT: 36.4 % (ref 36.0–46.0)
Hemoglobin: 11.7 g/dL — ABNORMAL LOW (ref 12.0–15.0)
Immature Granulocytes: 2 %
Lymphocytes Relative: 6 %
Lymphs Abs: 0.7 10*3/uL (ref 0.7–4.0)
MCH: 30.1 pg (ref 26.0–34.0)
MCHC: 32.1 g/dL (ref 30.0–36.0)
MCV: 93.6 fL (ref 80.0–100.0)
Monocytes Absolute: 0.9 10*3/uL (ref 0.1–1.0)
Monocytes Relative: 8 %
Neutro Abs: 9.6 10*3/uL — ABNORMAL HIGH (ref 1.7–7.7)
Neutrophils Relative %: 83 %
Platelets: 205 10*3/uL (ref 150–400)
RBC: 3.89 MIL/uL (ref 3.87–5.11)
RDW: 13 % (ref 11.5–15.5)
WBC: 11.7 10*3/uL — ABNORMAL HIGH (ref 4.0–10.5)
nRBC: 0 % (ref 0.0–0.2)

## 2019-10-03 LAB — COMPREHENSIVE METABOLIC PANEL
ALT: 59 U/L — ABNORMAL HIGH (ref 0–44)
AST: 39 U/L (ref 15–41)
Albumin: 2.4 g/dL — ABNORMAL LOW (ref 3.5–5.0)
Alkaline Phosphatase: 181 U/L — ABNORMAL HIGH (ref 38–126)
Anion gap: 9 (ref 5–15)
BUN: 5 mg/dL — ABNORMAL LOW (ref 6–20)
CO2: 25 mmol/L (ref 22–32)
Calcium: 8.2 mg/dL — ABNORMAL LOW (ref 8.9–10.3)
Chloride: 104 mmol/L (ref 98–111)
Creatinine, Ser: 0.59 mg/dL (ref 0.44–1.00)
GFR calc Af Amer: >60 mL/min (ref >60)
GFR calc non Af Amer: >60 mL/min (ref >60)
Glucose, Bld: 116 mg/dL — ABNORMAL HIGH (ref 70–99)
Potassium: 3.1 mmol/L — ABNORMAL LOW (ref 3.5–5.1)
Sodium: 138 mmol/L (ref 135–145)
Total Bilirubin: 1.1 mg/dL (ref 0.3–1.2)
Total Protein: 6.3 g/dL — ABNORMAL LOW (ref 6.5–8.1)

## 2019-10-03 MED ORDER — IOHEXOL 300 MG/ML  SOLN
100.0000 mL | Freq: Once | INTRAMUSCULAR | Status: AC | PRN
  Administered 2019-10-03: 100 mL via INTRAVENOUS

## 2019-10-03 MED ORDER — IOHEXOL 9 MG/ML PO SOLN
500.0000 mL | ORAL | Status: AC
  Administered 2019-10-03 (×2): 500 mL via ORAL

## 2019-10-03 MED ORDER — POTASSIUM CHLORIDE CRYS ER 20 MEQ PO TBCR
40.0000 meq | EXTENDED_RELEASE_TABLET | Freq: Two times a day (BID) | ORAL | Status: AC
  Administered 2019-10-03 (×2): 40 meq via ORAL
  Filled 2019-10-03 (×2): qty 2

## 2019-10-03 NOTE — Progress Notes (Signed)
[facility] Progress Note  4 Days Post-Op  Subjective: Doing fair but her pain continues. Says she did not take much medication last night. Doing water and juice. CT with significant inflammation around the pancreas and acute pancreatitis, no necrosis.   Objective: Vital signs in last 24 hours: Temp:  [98.1 F (36.7 C)-99 F (37.2 C)] 98.1 F (36.7 C) (11/24 0433) Pulse Rate:  [86-91] 86 (11/24 0433) Resp:  [16-20] 16 (11/24 0433) BP: (135-146)/(78-100) 146/78 (11/24 0433) SpO2:  [96 %-97 %] 97 % (11/24 0433) Last BM Date: 09/27/19  Intake/Output from previous day: 11/23 0701 - 11/24 0700 In: 480 [P.O.:480] Out: 1000 [Urine:1000] Intake/Output this shift: No intake/output data recorded.  General appearance: alert, cooperative and no distress Resp: normal work of breathing GI: soft, mildly distended, epigastric tenderness and fullness  Lab Results:  Recent Labs    10/01/19 0704 10/03/19 0628  WBC 14.3* 11.7*  HGB 12.3 11.7*  HCT 39.0 36.4  PLT 174 205   BMET Recent Labs    10/02/19 0635 10/03/19 0628  NA 135 138  K 4.0 3.1*  CL 100 104  CO2 26 25  GLUCOSE 102* 116*  BUN 5* 5*  CREATININE 0.59 0.59  CALCIUM 8.0* 8.2*   PT/INR No results for input(s): LABPROT, INR in the last 72 hours. Personally reviewed CT and showed to patient- significant inflammation around the pancreas extending out several inches around, significant edema, biliary system no longer dilated   Studies/Results: Ct Abdomen Pelvis W Contrast  Result Date: 10/03/2019 CLINICAL DATA:  53 year old female with recent gallstone pancreatitis, ERCP. EXAM: CT ABDOMEN AND PELVIS WITH CONTRAST TECHNIQUE: Multidetector CT imaging of the abdomen and pelvis was performed using the standard protocol following bolus administration of intravenous contrast. CONTRAST:  124mL OMNIPAQUE IOHEXOL 300 MG/ML  SOLN COMPARISON:  CT Abdomen and Pelvis 09/28/2019 and earlier. FINDINGS: Lower chest:  Moderate new left and small layering right pleural effusions with confluent associated lower lobe atelectasis. Middle lobes remain clear. No pericardial effusion. Hepatobiliary: Resolved intra-and extrahepatic biliary ductal dilatation since 09/28/2019. Liver enhancement within normal limits (stable small right hepatic lobe subcentimeter low-density area on series 2, image 30, probable benign cyst). The gallbladder now is contracted with mild wall thickening. Pancreas: Confluent inflammation throughout the lesser sac with stranding and fluid. Pancreatic enhancement is maintained, no pancreatic necrosis identified. No pancreatic ductal dilatation. No organized or rim enhancing fluid collection. Spleen: Negative aside from adjacent inflammation and free fluid. Adrenals/Urinary Tract: Normal adrenal glands. Bilateral renal enhancement and contrast excretion appears symmetric and within normal limits. Unremarkable urinary bladder. Stable pelvic phleboliths. Stomach/Bowel: Decompressed descending and rectosigmoid colon. Diverticulosis from the proximal sigmoid throughout the left colon to the splenic flexure. Oral contrast has reached the descending colon. Negative transverse colon. Right colon is on a lax mesentery. Normal appendix on series 2, image 61. No large bowel inflammation. Negative terminal ileum. No dilated small bowel. Inflammation throughout the lesser sac with mild mass effect on the stomach and duodenum. No free air. Vascular/Lymphatic: Calcified aortic atherosclerosis. Major arterial structures remain patent. Portal venous system including the splenic vein remain patent. No lymphadenopathy. Reproductive: Negative. Other: New small to moderate volume of pelvic free fluid with simple fluid density. Musculoskeletal: No acute osseous abnormality identified. IMPRESSION: 1. Acute pancreatitis with extensive inflammation and fluid tracking from the lesser sac. No pancreatic necrosis. No organized or rim  enhancing fluid collection. 2. Resolved biliary and gallbladder dilatation. 3. New moderate left and small right layering pleural  effusions with lung base atelectasis. New pelvic free fluid. 4. Large bowel diverticulosis. Aortic Atherosclerosis (ICD10-I70.0). Electronically Signed   By: Genevie Ann M.D.   On: 10/03/2019 09:06   Dg Chest Port 1 View  Result Date: 10/01/2019 CLINICAL DATA:  Dyspnea. EXAM: PORTABLE CHEST 1 VIEW COMPARISON:  07/29/2012 FINDINGS: The lung volumes are low. The heart size is mildly enlarged. There is a small to moderate-sized left-sided pleural effusion. There is a left-sided airspace opacity favored to represent atelectasis. There is no displaced fracture. IMPRESSION: 1. Small to moderate-sized left-sided pleural effusion. 2. Left-sided airspace opacity favored to represent atelectasis. 3. Mild cardiomegaly. Electronically Signed   By: Constance Holster M.D.   On: 10/01/2019 16:55    Anti-infectives: Anti-infectives (From admission, onward)   Start     Dose/Rate Route Frequency Ordered Stop   09/28/19 1030  cefTRIAXone (ROCEPHIN) 1 g in sodium chloride 0.9 % 100 mL IVPB     1 g 200 mL/hr over 30 Minutes Intravenous Every 24 hours 09/28/19 1016        Assessment/Plan: Donna Vega is a 53 yo with acute gallstone pancreatitis who has continued pain and significant inflammation of the pancreas. Given this we are going to delay surgery for a few weeks to let the inflammation improve.   -Diet as tolerated -Will need to see as outpatient, we understand risk of recurrent gallstone pancreatitis but also risk associated with surgery in an edematous/ inflamed field and risk of injury to other organs/ need for open procedure  -Will continue to follow   -Will get my office to make her an appt for 2 weeks to follow up with me.   Updated Dr. Manuella Ghazi.    LOS: 5 days    Virl Cagey 10/03/2019

## 2019-10-03 NOTE — Progress Notes (Addendum)
PROGRESS NOTE    KEYASHA SLIMMER  [id number] DOB: [birth year] DOA: 09/28/2019 PCP: Tylene Fantasia, NP   Brief Narrative:  Per HPI: [name] a53 y.o.femalew depression, gerd, apparently has had intermittent abdominal pain for the past 1 month. Over the past 2 days the pain has worsened. Epgiastric, sharp pain, Associated with n/v, but no bloody emesis. Pt denies fever, chills, cough, cp, palp, sob, diarrhea, brbpr, black stool, dysuria, hematuria.  Patient was noted to have biliary pancreatitis noted on CT abdomen and pelvis along with lipase elevation of 3832. She has been seen by GI today with plans to keep n.p.o. with aggressive fluid resuscitation.  11/20:Patient has undergone ERCP this morning with biliary sphincterotomy and no common bile duct stone noted. GI recommends to stay off heparin agents for the next 72 hours. General surgery consulted for cholecystectomy once pancreatitis resolves. Appreciate further recommendations. She is to remain n.p.o. with ongoing IV fluid as she is still quite symptomatic. LFTs and lipase is noted to be downtrending. Will repeat labs in a.m.  11/21:Patient appears to be feeling better this morning with less abdominal pain and nausea. We will plan to advance diet to clear liquids and decrease IV fluid rate to 125 cc/h. Appreciate further GI recommendations. General surgery planning for laparoscopic cholecystectomy on 11/24.  11/22:Patient continues to have some intermittent abdominal pain, but no nausea or vomiting. She has some hypokalemia which will be repleted. Plan to recheck labs in a.m.  11/23: Patient continues to have ongoing abdominal pain that is intermittent in nature.  She appears to be tolerating some of her clear liquids.  IV fluid has been discontinued as a result of some expiratory wheezing and noted cardiomegaly with left sided pleural effusion.  She appears to have a component of volume overload at  this time.  LFTs are decreasing.  General surgery plans for repeat CT as well as CBC with differential in a.m. and may consider surgery by 11/25 if appropriate.  11/24: Patient states that her abdominal pain has improved overnight and she has not required narcotic pain medications.  She is still self limiting her clear liquids, however.  Discussed case with general surgery after CT of the abdomen demonstrated ongoing extensive inflammation of the pancreas.  Surgery will have to be delayed for several weeks until the pancreatitis resolves.  We will plan to try to advance diet to full liquid today and anticipate discharge in the next 1-2 days if stable and tolerating diet.  Will need to have elective laparoscopic cholecystectomy in the near future.  Assessment & Plan:   Principal Problem:   Acute gallstone pancreatitis Active Problems:   Pancreatitis   Abnormal liver function   Depression   GERD (gastroesophageal reflux disease)   Biliary pancreatitis status post ERCP with sphincterotomy -DC further IV fluid due to volume overload and monitor a.m. labs -Advance diet to full liquid -Rocephin as prescribed for intra-abdominal coverage for 1 more day and DC tomorrow -Appreciate general surgery consultation for cholecystectomy once pancreatitis resolves, will need to be in outpatient setting once pancreatitis resolves.  Extensive inflammation of the pancreas noted on CT abdomen today. -Zofran as needed for nausea vomiting -Dilaudid for pain management -Appreciate GI and general surgery recommendations  Hypokalemia -Replete orally and recheck in a.m. along with magnesium  Depression -Resume home Paxil  GERD -Protonix 40 mg IV daily   DVT prophylaxis:SCDs Code Status:Full Family Communication:Discussed with husband, [name] at [place] 11/20 Disposition Plan:Advance diet to full liquid and see how  patient tolerates.  Continue off IV fluid as she is volume overloaded.  Plan for  discharge in the next 1-2 days if tolerating diet better and symptoms have resolved.  Will need to have cholecystectomy planned electively in the near future.   Consultants:  GI  General surgery  Procedures:  None  Antimicrobials:  Anti-infectives (From admission, onward)   Start     Dose/Rate Route Frequency Ordered Stop   09/28/19 1030  cefTRIAXone (ROCEPHIN) 1 g in sodium chloride 0.9 % 100 mL IVPB     1 g 200 mL/hr over 30 Minutes Intravenous Every 24 hours 09/28/19 1016         Subjective: Patient seen and evaluated today with no new acute complaints or concerns. No acute concerns or events noted overnight.  She states that her pain is improved and she has not required IV pain medications.  She is tolerating clear liquid diet.  Objective: Vitals:   10/02/19 0800 10/02/19 1515 10/02/19 2247 10/03/19 0433  BP: 137/72 (!) 135/100 (!) 143/79 (!) 146/78  Pulse: 95 89 91 86  Resp:   20 16  Temp: 98.2 F (36.8 C) 98.3 F (36.8 C) 99 F (37.2 C) 98.1 F (36.7 C)  TempSrc: Oral Oral Oral Oral  SpO2: 95% 96% 96% 97%  Weight:      Height:        Intake/Output Summary (Last 24 hours) at 10/03/2019 1030 Last data filed at 10/02/2019 2300 Gross per 24 hour  Intake 480 ml  Output 1000 ml  Net -520 ml   Filed Weights   09/28/19 2140 09/30/19 0601 10/01/19 0555  Weight: 98.1 kg 104.6 kg 104.7 kg    Examination:  General exam: Appears calm and comfortable  Respiratory system: Clear to auscultation. Respiratory effort normal. Cardiovascular system: S1 & S2 heard, RRR. No JVD, murmurs, rubs, gallops or clicks. No pedal edema. Gastrointestinal system: Abdomen is nondistended, soft and nontender. No organomegaly or masses felt. Normal bowel sounds heard. Central nervous system: Alert and oriented. No focal neurological deficits. Extremities: Symmetric 5 x 5 power. Skin: No rashes, lesions or ulcers Psychiatry: Judgement and insight appear normal. Mood & affect  appropriate.     Data Reviewed: I have personally reviewed following labs and imaging studies  CBC: Recent Labs  Lab 09/28/19 0253 09/28/19 1152 09/29/19 0507 09/30/19 0740 10/01/19 0704 10/03/19 0628  WBC 12.2* 17.2* 18.5* 15.9* 14.3* 11.7*  NEUTROABS 11.1*  --   --   --   --  9.6*  HGB 16.1* 16.0* 14.4 13.1 12.3 11.7*  HCT 50.0* 50.0* 44.8 41.2 39.0 36.4  MCV 92.6 93.6 94.3 96.3 94.2 93.6  PLT 212 195 204 123* 174 [id number]   Basic Metabolic Panel: Recent Labs  Lab 09/29/19 0507 09/30/19 0740 10/01/19 0704 10/02/19 0635 10/03/19 0628  NA 138 134* 134* 135 138  K 3.6 3.6 3.2* 4.0 3.1*  CL 105 104 100 100 104  CO2 23 21* 26 26 25   GLUCOSE 108* 91 90 102* 116*  BUN 19 10 7  5* 5*  CREATININE 0.63 0.61 0.57 0.59 0.59  CALCIUM 7.9* 7.9* 8.0* 8.0* 8.2*  MG  --   --   --  2.3  --    GFR: Estimated Creatinine Clearance: 105.3 mL/min (by C-G formula based on SCr of 0.59 mg/dL). Liver Function Tests: Recent Labs  Lab 09/29/19 0507 09/30/19 0740 10/01/19 0704 10/02/19 0635 10/03/19 0628  AST 106* 36 26 23 39  ALT 257* 128* 92* 63*  59*  ALKPHOS 371* 236* 212* 182* 181*  BILITOT 2.4* 1.5* 1.4* 1.0 1.1  PROT 6.2* 5.7* 6.3* 6.0* 6.3*  ALBUMIN 2.9* 2.5* 2.5* 2.3* 2.4*   Recent Labs  Lab 09/28/19 0253 09/29/19 0507 09/30/19 0740 10/01/19 0704 10/02/19 0635  LIPASE 3,832* 1,159* 117* 52* 36   No results for input(s): AMMONIA in the last 168 hours. Coagulation Profile: No results for input(s): INR, PROTIME in the last 168 hours. Cardiac Enzymes: No results for input(s): CKTOTAL, CKMB, CKMBINDEX, TROPONINI in the last 168 hours. BNP (last 3 results) No results for input(s): PROBNP in the last 8760 hours. HbA1C: No results for input(s): HGBA1C in the last 72 hours. CBG: No results for input(s): GLUCAP in the last 168 hours. Lipid Profile: No results for input(s): CHOL, HDL, LDLCALC, TRIG, CHOLHDL, LDLDIRECT in the last 72 hours. Thyroid Function Tests: No results  for input(s): TSH, T4TOTAL, FREET4, T3FREE, THYROIDAB in the last 72 hours. Anemia Panel: No results for input(s): VITAMINB12, FOLATE, FERRITIN, TIBC, IRON, RETICCTPCT in the last 72 hours. Sepsis Labs: No results for input(s): PROCALCITON, LATICACIDVEN in the last 168 hours.  Recent Results (from the past 240 hour(s))  SARS Coronavirus 2 by RT PCR (hospital order, performed in [facility] hospital lab) Nasopharyngeal Nasopharyngeal Swab     Status: None   Collection Time: 09/28/19  6:30 AM   Specimen: Nasopharyngeal Swab  Result Value Ref Range Status   SARS Coronavirus 2 NEGATIVE NEGATIVE Final    Comment: (NOTE) If result is NEGATIVE SARS-CoV-2 target nucleic acids are NOT DETECTED. The SARS-CoV-2 RNA is generally detectable in upper and lower  respiratory specimens during the acute phase of infection. The lowest  concentration of SARS-CoV-2 viral copies this assay can detect is 250  copies / mL. A negative result does not preclude SARS-CoV-2 infection  and should not be used as the sole basis for treatment or other  patient management decisions.  A negative result may occur with  improper specimen collection / handling, submission of specimen other  than nasopharyngeal swab, presence of viral mutation(s) within the  areas targeted by this assay, and inadequate number of viral copies  (<250 copies / mL). A negative result must be combined with clinical  observations, patient history, and epidemiological information. If result is POSITIVE SARS-CoV-2 target nucleic acids are DETECTED. The SARS-CoV-2 RNA is generally detectable in upper and lower  respiratory specimens dur ing the acute phase of infection.  Positive  results are indicative of active infection with SARS-CoV-2.  Clinical  correlation with patient history and other diagnostic information is  necessary to determine patient infection status.  Positive results do  not rule out bacterial infection or co-infection with  other viruses. If result is PRESUMPTIVE POSTIVE SARS-CoV-2 nucleic acids MAY BE PRESENT.   A presumptive positive result was obtained on the submitted specimen  and confirmed on repeat testing.  While 2019 novel coronavirus  (SARS-CoV-2) nucleic acids may be present in the submitted sample  additional confirmatory testing may be necessary for epidemiological  and / or clinical management purposes  to differentiate between  SARS-CoV-2 and other Sarbecovirus currently known to infect humans.  If clinically indicated additional testing with an alternate test  methodology [phone]) is advised. The SARS-CoV-2 RNA is generally  detectable in upper and lower respiratory sp ecimens during the acute  phase of infection. The expected result is Negative. Fact Sheet for Patients:  StrictlyIdeas.no Fact Sheet for Healthcare Providers: BankingDealers.co.za This test is not yet approved  or cleared by the [place] and has been authorized for detection and/or diagnosis of SARS-CoV-2 by FDA under an Emergency Use Authorization (EUA).  This EUA will remain in effect (meaning this test can be used) for the duration of the COVID-19 declaration under Section 564(b)(1) of the Act, 21 U.S.C. section 360bbb-3(b)(1), unless the authorization is terminated or revoked sooner. Performed at [facility], [address], [place], [place] [address]          Radiology Studies: Ct Abdomen Pelvis W Contrast  Result Date: 10/03/2019 CLINICAL DATA:  53 year old female with recent gallstone pancreatitis, ERCP. EXAM: CT ABDOMEN AND PELVIS WITH CONTRAST TECHNIQUE: Multidetector CT imaging of the abdomen and pelvis was performed using the standard protocol following bolus administration of intravenous contrast. CONTRAST:  143mL OMNIPAQUE IOHEXOL 300 MG/ML  SOLN COMPARISON:  CT Abdomen and Pelvis 09/28/2019 and earlier. FINDINGS: Lower chest: Moderate new left and  small layering right pleural effusions with confluent associated lower lobe atelectasis. Middle lobes remain clear. No pericardial effusion. Hepatobiliary: Resolved intra-and extrahepatic biliary ductal dilatation since 09/28/2019. Liver enhancement within normal limits (stable small right hepatic lobe subcentimeter low-density area on series 2, image 30, probable benign cyst). The gallbladder now is contracted with mild wall thickening. Pancreas: Confluent inflammation throughout the lesser sac with stranding and fluid. Pancreatic enhancement is maintained, no pancreatic necrosis identified. No pancreatic ductal dilatation. No organized or rim enhancing fluid collection. Spleen: Negative aside from adjacent inflammation and free fluid. Adrenals/Urinary Tract: Normal adrenal glands. Bilateral renal enhancement and contrast excretion appears symmetric and within normal limits. Unremarkable urinary bladder. Stable pelvic phleboliths. Stomach/Bowel: Decompressed descending and rectosigmoid colon. Diverticulosis from the proximal sigmoid throughout the left colon to the splenic flexure. Oral contrast has reached the descending colon. Negative transverse colon. Right colon is on a lax mesentery. Normal appendix on series 2, image 61. No large bowel inflammation. Negative terminal ileum. No dilated small bowel. Inflammation throughout the lesser sac with mild mass effect on the stomach and duodenum. No free air. Vascular/Lymphatic: Calcified aortic atherosclerosis. Major arterial structures remain patent. Portal venous system including the splenic vein remain patent. No lymphadenopathy. Reproductive: Negative. Other: New small to moderate volume of pelvic free fluid with simple fluid density. Musculoskeletal: No acute osseous abnormality identified. IMPRESSION: 1. Acute pancreatitis with extensive inflammation and fluid tracking from the lesser sac. No pancreatic necrosis. No organized or rim enhancing fluid collection.  2. Resolved biliary and gallbladder dilatation. 3. New moderate left and small right layering pleural effusions with lung base atelectasis. New pelvic free fluid. 4. Large bowel diverticulosis. Aortic Atherosclerosis (ICD10-I70.0). Electronically Signed   By: Genevie Ann M.D.   On: 10/03/2019 09:06   Dg Chest Port 1 View  Result Date: 10/01/2019 CLINICAL DATA:  Dyspnea. EXAM: PORTABLE CHEST 1 VIEW COMPARISON:  07/29/2012 FINDINGS: The lung volumes are low. The heart size is mildly enlarged. There is a small to moderate-sized left-sided pleural effusion. There is a left-sided airspace opacity favored to represent atelectasis. There is no displaced fracture. IMPRESSION: 1. Small to moderate-sized left-sided pleural effusion. 2. Left-sided airspace opacity favored to represent atelectasis. 3. Mild cardiomegaly. Electronically Signed   By: Constance Holster M.D.   On: 10/01/2019 16:55        Scheduled Meds: . pantoprazole  40 mg Oral QAC breakfast  . PARoxetine  20 mg Oral Daily  . potassium chloride  40 mEq Oral BID   Continuous Infusions: . cefTRIAXone (ROCEPHIN)  IV 1 g (10/03/19 0934)  LOS: 5 days    Time spent: 30 minutes    Illias Pantano Darleen Crocker, DO [facility] Pager [phone]  If 7PM-7AM, please contact night-coverage [email or web address] Password [email or web address] 10/03/2019, 10:30 AM

## 2019-10-04 DIAGNOSIS — F329 Major depressive disorder, single episode, unspecified: Secondary | ICD-10-CM

## 2019-10-04 DIAGNOSIS — R945 Abnormal results of liver function studies: Secondary | ICD-10-CM

## 2019-10-04 DIAGNOSIS — K219 Gastro-esophageal reflux disease without esophagitis: Secondary | ICD-10-CM

## 2019-10-04 LAB — COMPREHENSIVE METABOLIC PANEL
ALT: 87 U/L — ABNORMAL HIGH (ref 0–44)
AST: 90 U/L — ABNORMAL HIGH (ref 15–41)
Albumin: 2.5 g/dL — ABNORMAL LOW (ref 3.5–5.0)
Alkaline Phosphatase: 180 U/L — ABNORMAL HIGH (ref 38–126)
Anion gap: 11 (ref 5–15)
BUN: 7 mg/dL (ref 6–20)
CO2: 23 mmol/L (ref 22–32)
Calcium: 8.6 mg/dL — ABNORMAL LOW (ref 8.9–10.3)
Chloride: 104 mmol/L (ref 98–111)
Creatinine, Ser: 0.64 mg/dL (ref 0.44–1.00)
GFR calc Af Amer: >60 mL/min (ref >60)
GFR calc non Af Amer: >60 mL/min (ref >60)
Glucose, Bld: 108 mg/dL — ABNORMAL HIGH (ref 70–99)
Potassium: 4.3 mmol/L (ref 3.5–5.1)
Sodium: 138 mmol/L (ref 135–145)
Total Bilirubin: 0.9 mg/dL (ref 0.3–1.2)
Total Protein: 6.4 g/dL — ABNORMAL LOW (ref 6.5–8.1)

## 2019-10-04 LAB — CBC
HCT: 36.3 % (ref 36.0–46.0)
Hemoglobin: 11.7 g/dL — ABNORMAL LOW (ref 12.0–15.0)
MCH: 29.8 pg (ref 26.0–34.0)
MCHC: 32.2 g/dL (ref 30.0–36.0)
MCV: 92.6 fL (ref 80.0–100.0)
Platelets: 249 10*3/uL (ref 150–400)
RBC: 3.92 MIL/uL (ref 3.87–5.11)
RDW: 13.1 % (ref 11.5–15.5)
WBC: 11.8 10*3/uL — ABNORMAL HIGH (ref 4.0–10.5)
nRBC: 0 % (ref 0.0–0.2)

## 2019-10-04 LAB — MAGNESIUM: Magnesium: 2.2 mg/dL (ref 1.7–2.4)

## 2019-10-04 LAB — LIPASE, BLOOD: Lipase: 114 U/L — ABNORMAL HIGH (ref 11–51)

## 2019-10-04 SURGERY — LAPAROSCOPIC CHOLECYSTECTOMY
Anesthesia: General

## 2019-10-04 MED ORDER — PANTOPRAZOLE SODIUM 40 MG PO TBEC: 40.0000 mg | DELAYED_RELEASE_TABLET | Freq: Every day | ORAL | 3 refills | Status: DC

## 2019-10-04 MED ORDER — ACETAMINOPHEN 325 MG PO TABS: 650.0000 mg | ORAL_TABLET | Freq: Four times a day (QID) | ORAL | 0 refills | Status: AC | PRN

## 2019-10-04 MED ORDER — ONDANSETRON 8 MG PO TBDP: 4.0000 mg | ORAL_TABLET | Freq: Three times a day (TID) | ORAL | 0 refills | Status: DC | PRN

## 2019-10-04 NOTE — Discharge Summary (Signed)
Physician Discharge Summary  Donna Vega:[id number] DOB: 07/10/1966 DOA: 09/28/2019  PCP: Donna Fantasia, NP  Admit date: 09/28/2019 Discharge date: 10/04/2019  Time spent: 35 minutes  Recommendations for Outpatient Follow-up:  1. Repeat complete metabolic panel to follow electrolytes, renal function and LFTs. 2. Outpatient follow-up with general surgery for elective cholecystectomy.   Discharge Diagnoses:  Principal Problem:   Acute gallstone pancreatitis Active Problems:   Pancreatitis   Abnormal liver function   Depression   GERD (gastroesophageal reflux disease)   Discharge Condition: Stable and improved.  Patient discharged home with instruction to follow-up with PCP and general surgery as an outpatient.  Diet recommendation: Low-fat diet  Filed Weights   09/30/19 0601 10/01/19 0555 10/04/19 0500  Weight: 104.6 kg 104.7 kg 97.1 kg    History of present illness:  As per H&P written by Dr. Jani Gravel on 09/28/2019 53 y.o. female  w depression, gerd, apparently has had intermittent abdominal pain for the past 1 month.  Over the past 2 days the pain has worsened.  Epgiastric, sharp pain,  Associated with n/v, but no bloody emesis.  Pt denies fever, chills, cough, cp, palp, sob, diarrhea, brbpr, black stool, dysuria, hematuria.   In ED,  T 98.2 P 60 Bp 164/83 pox 99% RA Wt 96.6kg\  CT abd/ pelvis IMPRESSION: Acute edematous pancreatitis without organized collection. The cause is biliary, there is new marked biliary dilatation with 2 small calculi at the level of the ampulla.  Wbc 12.2, HGb 16.1, Plt 212 Na 137, K 3.9, Bun 20, Creatinine 0.84 Ast 330, Alt 468, Alk phos 540, T. Bili 5.8  Lipase 3,832  Hospital Course:  1-gallstone pancreatitis with biliary obstruction -Status post ERCP with sphincterectomy -No nausea, no vomiting and no significant abdominal discomfort at time of discharge time-improve lipase and LFTs overall prior to discharge and able  to tolerate diet -Patient has been discharged on as needed Tylenol and Zofran for any remanent symptoms at time of discharge -Outpatient follow-up will be arranged with general surgery for elective cholecystectomy. -Patient has completed IV antibiotics while inpatient to assist with the cooling of process of her condition.  2-transaminitis -Status post ERCP and sphincterectomy -Repeat LFTs at follow-up visit to assess liver function trend. -No icterus  3-hypokalemia -Repleted and within normal limits at discharge -Normal magnesium level.  4-gastroesophageal flux disease -Continue PPI.  5-depression -Resume home antidepressant regimen -Outpatient follow-up with referral to psychiatry service for further adjustment of medication as needed -Mood overall stable -No suicidal ideation or hallucinations currently.  Procedures:  See below for x-ray reports.  Consultations:  General surgery  Discharge Exam: Vitals:   10/03/19 2229 10/04/19 0512  BP: (!) 153/81 127/78  Pulse: 84 76  Resp: 20 16  Temp: 99.7 F (37.6 C) 98.5 F (36.9 C)  SpO2: 96% 97%    General: Afebrile, no nausea, no vomiting.  Tolerating diet and expressing very short, intermittent and spontaneously relieved abdominal cramps. Cardiovascular: S1 and S2, no rubs, no gallops, no murmurs. Respiratory: Normal respiratory effort, good air movement bilaterally. Abdomen: Soft, no guarding, positive bowel sounds, no distention. Extremities: No edema, no cyanosis or clubbing.  Discharge Instructions   Discharge Instructions    Discharge instructions   Complete by: As directed    Take medications as prescribed Maintain adequate hydration Follow low-fat diet Outpatient follow-up with general surgery as instructed Arrange follow-up with PCP in 10 days.     Allergies as of 10/04/2019   No Known Allergies  Medication List    TAKE these medications   acetaminophen 325 MG tablet Commonly known as:  TYLENOL Take 2 tablets (650 mg total) by mouth every 6 (six) hours as needed for mild pain or headache (or Fever >/= 101).   ondansetron 8 MG disintegrating tablet Commonly known as: Zofran ODT Take 0.5 tablets (4 mg total) by mouth every 8 (eight) hours as needed for nausea or vomiting. What changed: medication strength   pantoprazole 40 MG tablet Commonly known as: PROTONIX Take 1 tablet (40 mg total) by mouth daily. What changed:   medication strength  how much to take   PARoxetine 20 MG tablet Commonly known as: PAXIL Take 20 mg by mouth daily.      No Known Allergies Follow-up Information    Donna Fantasia, NP. Schedule an appointment as soon as possible for a visit in 10 day(s).   Specialty: Nurse Practitioner Contact information: [address] [place] [address] [phone]           The results of significant diagnostics from this hospitalization (including imaging, microbiology, ancillary and laboratory) are listed below for reference.    Significant Diagnostic Studies: Dg Abd 1 View  Result Date: 09/29/2019 CLINICAL DATA:  Abdominal pain. Gastroesophageal reflux disease. Melanoma. EXAM: ABDOMEN - 1 VIEW COMPARISON:  CT of 1 day prior FINDINGS: Two supine views. Gas-filled, non dilated large and small bowel loops. No free intraperitoneal air. Contrast within the colon. IMPRESSION: No acute findings. Electronically Signed   By: Abigail Miyamoto M.D.   On: 09/29/2019 20:36   Ct Abdomen Pelvis W Contrast  Result Date: 10/03/2019 CLINICAL DATA:  53 year old female with recent gallstone pancreatitis, ERCP. EXAM: CT ABDOMEN AND PELVIS WITH CONTRAST TECHNIQUE: Multidetector CT imaging of the abdomen and pelvis was performed using the standard protocol following bolus administration of intravenous contrast. CONTRAST:  146m OMNIPAQUE IOHEXOL 300 MG/ML  SOLN COMPARISON:  CT Abdomen and Pelvis 09/28/2019 and earlier. FINDINGS: Lower chest: Moderate new left and  small layering right pleural effusions with confluent associated lower lobe atelectasis. Middle lobes remain clear. No pericardial effusion. Hepatobiliary: Resolved intra-and extrahepatic biliary ductal dilatation since 09/28/2019. Liver enhancement within normal limits (stable small right hepatic lobe subcentimeter low-density area on series 2, image 30, probable benign cyst). The gallbladder now is contracted with mild wall thickening. Pancreas: Confluent inflammation throughout the lesser sac with stranding and fluid. Pancreatic enhancement is maintained, no pancreatic necrosis identified. No pancreatic ductal dilatation. No organized or rim enhancing fluid collection. Spleen: Negative aside from adjacent inflammation and free fluid. Adrenals/Urinary Tract: Normal adrenal glands. Bilateral renal enhancement and contrast excretion appears symmetric and within normal limits. Unremarkable urinary bladder. Stable pelvic phleboliths. Stomach/Bowel: Decompressed descending and rectosigmoid colon. Diverticulosis from the proximal sigmoid throughout the left colon to the splenic flexure. Oral contrast has reached the descending colon. Negative transverse colon. Right colon is on a lax mesentery. Normal appendix on series 2, image 61. No large bowel inflammation. Negative terminal ileum. No dilated small bowel. Inflammation throughout the lesser sac with mild mass effect on the stomach and duodenum. No free air. Vascular/Lymphatic: Calcified aortic atherosclerosis. Major arterial structures remain patent. Portal venous system including the splenic vein remain patent. No lymphadenopathy. Reproductive: Negative. Other: New small to moderate volume of pelvic free fluid with simple fluid density. Musculoskeletal: No acute osseous abnormality identified. IMPRESSION: 1. Acute pancreatitis with extensive inflammation and fluid tracking from the lesser sac. No pancreatic necrosis. No organized or rim enhancing fluid  collection.  2. Resolved biliary and gallbladder dilatation. 3. New moderate left and small right layering pleural effusions with lung base atelectasis. New pelvic free fluid. 4. Large bowel diverticulosis. Aortic Atherosclerosis (ICD10-I70.0). Electronically Signed   By: Genevie Ann M.D.   On: 10/03/2019 09:06   Ct Abdomen Pelvis W Contrast  Result Date: 09/28/2019 CLINICAL DATA:  Acute generalized abdominal pain EXAM: CT ABDOMEN AND PELVIS WITH CONTRAST TECHNIQUE: Multidetector CT imaging of the abdomen and pelvis was performed using the standard protocol following bolus administration of intravenous contrast. CONTRAST:  166m OMNIPAQUE IOHEXOL 300 MG/ML  SOLN COMPARISON:  Five days ago FINDINGS: Lower chest: Subpleural nodule over the right diaphragm most consistent with lymph node. Hepatobiliary: New prominent bile duct dilatation that is both intra and extrahepatic. Two calcified stones are seen at the level of the distal CBD just before the ampulla, measuring up to 3 mm.Small cystic density in the right liver. Fluid around the gallbladder is likely secondary. No wall thickening typical of acute cholecystitis. Pancreas: New generalized pancreatic expansion and peripancreatic edema without organized collection or necrosis. Spleen: Unremarkable. Adrenals/Urinary Tract: Negative adrenals. No hydronephrosis or stone. Unremarkable bladder. Stomach/Bowel:  No obstruction. Moderate left colonic diverticulosis Vascular/Lymphatic: No acute vascular abnormality. No mass or adenopathy. Reproductive:No pathologic findings. Other: No ascites or pneumoperitoneum. Musculoskeletal: No acute abnormalities.  L5-S1 disc degeneration IMPRESSION: Acute edematous pancreatitis without organized collection. The cause is biliary, there is new marked biliary dilatation with 2 small calculi at the level of the ampulla. Electronically Signed   By: JMonte FantasiaM.D.   On: 09/28/2019 04:40   Ct Abdomen Pelvis W Contrast  Result Date:  09/21/2019 CLINICAL DATA:  Epigastric pain. Discoloration of the stool. Nausea and vomiting. Itching all over for 1 month. EXAM: CT ABDOMEN AND PELVIS WITH CONTRAST TECHNIQUE: Multidetector CT imaging of the abdomen and pelvis was performed using the standard protocol following bolus administration of intravenous contrast. CONTRAST:  1075mOMNIPAQUE IOHEXOL 300 MG/ML  SOLN COMPARISON:  None. FINDINGS: Lower chest: Clear lung bases.  Heart normal in size. Hepatobiliary: Liver normal in size and attenuation. Subcentimeter low-density lesion, segment cysts, consistent with a cyst. No other liver masses or lesions. Normal gallbladder. No bile duct dilation. Pancreas: Unremarkable. No pancreatic ductal dilatation or surrounding inflammatory changes. Spleen: Normal in size without focal abnormality. Adrenals/Urinary Tract: Adrenal glands are unremarkable. Kidneys are normal, without renal calculi, focal lesion, or hydronephrosis. Bladder is unremarkable. Stomach/Bowel: Normal stomach. Small bowel is normal in caliber. No wall thickening or inflammation. Colon is normal in caliber. There are left colon diverticula without diverticulitis. No colonic wall thickening or other inflammatory process. Normal appendix visualized. Vascular/Lymphatic: Aortic atherosclerosis. No aneurysm. No enlarged lymph nodes. Reproductive: Uterus and bilateral adnexa are unremarkable. Other: No abdominal wall hernia or abnormality. No abdominopelvic ascites. Musculoskeletal: No fracture or acute finding. No osteoblastic or osteolytic lesions. IMPRESSION: 1. No acute findings. No findings to account for the patient's symptoms. 2. Subcentimeter low-density liver lesion consistent with a cyst. Aortic atherosclerosis. Left colon diverticula without diverticulitis. Electronically Signed   By: DaLajean Manes.D.   On: 09/21/2019 14:38   Dg Chest Port 1 View  Result Date: 10/01/2019 CLINICAL DATA:  Dyspnea. EXAM: PORTABLE CHEST 1 VIEW COMPARISON:   07/29/2012 FINDINGS: The lung volumes are low. The heart size is mildly enlarged. There is a small to moderate-sized left-sided pleural effusion. There is a left-sided airspace opacity favored to represent atelectasis. There is no displaced fracture. IMPRESSION: 1. Small to moderate-sized  left-sided pleural effusion. 2. Left-sided airspace opacity favored to represent atelectasis. 3. Mild cardiomegaly. Electronically Signed   By: Constance Holster M.D.   On: 10/01/2019 16:55   Dg Ercp Biliary & Pancreatic Ducts  Result Date: 09/29/2019 CLINICAL DATA:  Biliary pancreatitis with calcified stones within the CBD. EXAM: ERCP TECHNIQUE: Multiple spot images obtained with the fluoroscopic device and submitted for interpretation post-procedure. COMPARISON:  CT abdomen and pelvis - 09/28/2019 FLUOROSCOPY TIME:  32 seconds FINDINGS: 4 spot intraoperative fluoroscopic image of the right upper abdominal quadrant are provided for review. Initial image demonstrates an ERCP probe over the right upper abdominal quadrant. Subsequent images demonstrate selective cannulation and opacification of the common bile duct which appears mildly dilated. There is opacification of the cystic duct with minimal opacification the neck of the gallbladder. There is no significant opacification intrahepatic biliary system or the pancreatic duct. IMPRESSION: ERCP as above. These images were submitted for radiologic interpretation only. Please see the procedural report for the amount of contrast and the fluoroscopy time utilized. Electronically Signed   By: Sandi Mariscal M.D.   On: 09/29/2019 08:59    Microbiology: Recent Results (from the past 240 hour(s))  SARS Coronavirus 2 by RT PCR (hospital order, performed in [facility] hospital lab) Nasopharyngeal Nasopharyngeal Swab     Status: None   Collection Time: 09/28/19  6:30 AM   Specimen: Nasopharyngeal Swab  Result Value Ref Range Status   SARS Coronavirus 2 NEGATIVE NEGATIVE Final     Comment: (NOTE) If result is NEGATIVE SARS-CoV-2 target nucleic acids are NOT DETECTED. The SARS-CoV-2 RNA is generally detectable in upper and lower  respiratory specimens during the acute phase of infection. The lowest  concentration of SARS-CoV-2 viral copies this assay can detect is 250  copies / mL. A negative result does not preclude SARS-CoV-2 infection  and should not be used as the sole basis for treatment or other  patient management decisions.  A negative result may occur with  improper specimen collection / handling, submission of specimen other  than nasopharyngeal swab, presence of viral mutation(s) within the  areas targeted by this assay, and inadequate number of viral copies  (<250 copies / mL). A negative result must be combined with clinical  observations, patient history, and epidemiological information. If result is POSITIVE SARS-CoV-2 target nucleic acids are DETECTED. The SARS-CoV-2 RNA is generally detectable in upper and lower  respiratory specimens dur ing the acute phase of infection.  Positive  results are indicative of active infection with SARS-CoV-2.  Clinical  correlation with patient history and other diagnostic information is  necessary to determine patient infection status.  Positive results do  not rule out bacterial infection or co-infection with other viruses. If result is PRESUMPTIVE POSTIVE SARS-CoV-2 nucleic acids MAY BE PRESENT.   A presumptive positive result was obtained on the submitted specimen  and confirmed on repeat testing.  While 2019 novel coronavirus  (SARS-CoV-2) nucleic acids may be present in the submitted sample  additional confirmatory testing may be necessary for epidemiological  and / or clinical management purposes  to differentiate between  SARS-CoV-2 and other Sarbecovirus currently known to infect humans.  If clinically indicated additional testing with an alternate test  methodology [phone]) is advised. The SARS-CoV-2  RNA is generally  detectable in upper and lower respiratory sp ecimens during the acute  phase of infection. The expected result is Negative. Fact Sheet for Patients:  StrictlyIdeas.no Fact Sheet for Healthcare Providers: BankingDealers.co.za This test is  not yet approved or cleared by the [place] and has been authorized for detection and/or diagnosis of SARS-CoV-2 by FDA under an Emergency Use Authorization (EUA).  This EUA will remain in effect (meaning this test can be used) for the duration of the COVID-19 declaration under Section 564(b)(1) of the Act, 21 U.S.C. section 360bbb-3(b)(1), unless the authorization is terminated or revoked sooner. Performed at [facility], [address], [place], [place] [address]      Labs: Basic Metabolic Panel: Recent Labs  Lab 09/30/19 0740 10/01/19 0704 10/02/19 0635 10/03/19 0628 10/04/19 0505  NA 134* 134* 135 138 138  K 3.6 3.2* 4.0 3.1* 4.3  CL 104 100 100 104 104  CO2 21* _0 GLUCOSE 91 90 102* 116* 108*  BUN 10 7 5* 5* 7  CREATININE 0.61 0.57 0.59 0.59 0.64  CALCIUM 7.9* 8.0* 8.0* 8.2* 8.6*  MG  --   --  2.3  --  2.2   Liver Function Tests: Recent Labs  Lab 09/30/19 0740 10/01/19 0704 10/02/19 0635 10/03/19 0628 10/04/19 0505  AST 36 26 23 39 90*  ALT 128* 92* 63* 59* 87*  ALKPHOS 236* 212* 182* 181* 180*  BILITOT 1.5* 1.4* 1.0 1.1 0.9  PROT 5.7* 6.3* 6.0* 6.3* 6.4*  ALBUMIN 2.5* 2.5* 2.3* 2.4* 2.5*   Recent Labs  Lab 09/29/19 0507 09/30/19 0740 10/01/19 0704 10/02/19 0635 10/04/19 0505  LIPASE 1,159* 117* 52* 36 114*   CBC: Recent Labs  Lab 09/28/19 0253  09/29/19 0507 09/30/19 0740 10/01/19 0704 10/03/19 0628 10/04/19 0505  WBC 12.2*   < > 18.5* 15.9* 14.3* 11.7* 11.8*  NEUTROABS 11.1*  --   --   --   --  9.6*  --   HGB 16.1*   < > 14.4 13.1 12.3 11.7* 11.7*  HCT 50.0*   < > 44.8 41.2 39.0 36.4 36.3  MCV 92.6   < > 94.3 96.3 94.2  93.6 92.6  PLT 212   < > 204 123* 174 205 249   < > = values in this interval not displayed.    Signed:  Barton Dubois MD.  [facility] 10/04/2019, 1:31 PM

## 2019-10-04 NOTE — Progress Notes (Signed)
[facility] Progress Note  5 Days Post-Op  Subjective: Doing fair. Pain improved. Eating. Wanting to go home.   Objective: Vital signs in last 24 hours: Temp:  [98.5 F (36.9 C)-99.7 F (37.6 C)] 98.5 F (36.9 C) (11/25 0512) Pulse Rate:  [76-84] 76 (11/25 0512) Resp:  [16-20] 16 (11/25 0512) BP: (127-153)/(78-81) 127/78 (11/25 0512) SpO2:  [96 %-97 %] 97 % (11/25 0512) Weight:  [97.1 kg] 97.1 kg (11/25 0500) Last BM Date: 10/03/19  Intake/Output from previous day: 11/24 0701 - 11/25 0700 In: 720 [P.O.:720] Out: 400 [Urine:400] Intake/Output this shift: No intake/output data recorded.  General appearance: alert, cooperative and no distress Resp: normal work of breathing GI: soft, mildy  tender epigastric, mildly distended  Lab Results:  Recent Labs    10/03/19 0628 10/04/19 0505  WBC 11.7* 11.8*  HGB 11.7* 11.7*  HCT 36.4 36.3  PLT 205 249   BMET Recent Labs    10/03/19 0628 10/04/19 0505  NA 138 138  K 3.1* 4.3  CL 104 104  CO2 25 23  GLUCOSE 116* 108*  BUN 5* 7  CREATININE 0.59 0.64  CALCIUM 8.2* 8.6*   PT/INR No results for input(s): LABPROT, INR in the last 72 hours.  Studies/Results: Ct Abdomen Pelvis W Contrast  Result Date: 10/03/2019 CLINICAL DATA:  53 year old female with recent gallstone pancreatitis, ERCP. EXAM: CT ABDOMEN AND PELVIS WITH CONTRAST TECHNIQUE: Multidetector CT imaging of the abdomen and pelvis was performed using the standard protocol following bolus administration of intravenous contrast. CONTRAST:  138mL OMNIPAQUE IOHEXOL 300 MG/ML  SOLN COMPARISON:  CT Abdomen and Pelvis 09/28/2019 and earlier. FINDINGS: Lower chest: Moderate new left and small layering right pleural effusions with confluent associated lower lobe atelectasis. Middle lobes remain clear. No pericardial effusion. Hepatobiliary: Resolved intra-and extrahepatic biliary ductal dilatation since 09/28/2019. Liver enhancement within normal limits  (stable small right hepatic lobe subcentimeter low-density area on series 2, image 30, probable benign cyst). The gallbladder now is contracted with mild wall thickening. Pancreas: Confluent inflammation throughout the lesser sac with stranding and fluid. Pancreatic enhancement is maintained, no pancreatic necrosis identified. No pancreatic ductal dilatation. No organized or rim enhancing fluid collection. Spleen: Negative aside from adjacent inflammation and free fluid. Adrenals/Urinary Tract: Normal adrenal glands. Bilateral renal enhancement and contrast excretion appears symmetric and within normal limits. Unremarkable urinary bladder. Stable pelvic phleboliths. Stomach/Bowel: Decompressed descending and rectosigmoid colon. Diverticulosis from the proximal sigmoid throughout the left colon to the splenic flexure. Oral contrast has reached the descending colon. Negative transverse colon. Right colon is on a lax mesentery. Normal appendix on series 2, image 61. No large bowel inflammation. Negative terminal ileum. No dilated small bowel. Inflammation throughout the lesser sac with mild mass effect on the stomach and duodenum. No free air. Vascular/Lymphatic: Calcified aortic atherosclerosis. Major arterial structures remain patent. Portal venous system including the splenic vein remain patent. No lymphadenopathy. Reproductive: Negative. Other: New small to moderate volume of pelvic free fluid with simple fluid density. Musculoskeletal: No acute osseous abnormality identified. IMPRESSION: 1. Acute pancreatitis with extensive inflammation and fluid tracking from the lesser sac. No pancreatic necrosis. No organized or rim enhancing fluid collection. 2. Resolved biliary and gallbladder dilatation. 3. New moderate left and small right layering pleural effusions with lung base atelectasis. New pelvic free fluid. 4. Large bowel diverticulosis. Aortic Atherosclerosis (ICD10-I70.0). Electronically Signed   By: Genevie Ann M.D.    On: 10/03/2019 09:06    Anti-infectives: Anti-infectives (From admission, onward)  Start     Dose/Rate Route Frequency Ordered Stop   09/28/19 1030  cefTRIAXone (ROCEPHIN) 1 g in sodium chloride 0.9 % 100 mL IVPB     1 g 200 mL/hr over 30 Minutes Intravenous Every 24 hours 09/28/19 1016        Assessment/Plan: Donna Vega is a patient with gallstone pancreatitis with significant inflammation. Will plan to see her in the office to discuss interval cholecystectomy after her inflammation has improved.   Future Appointments  Date Time Provider [place]  10/12/2019  1:00 PM Virl Cagey, MD [facility] None     LOS: 6 days    Virl Cagey 10/04/2019

## 2019-10-06 ENCOUNTER — Encounter: Payer: Self-pay | Admitting: General Surgery

## 2019-10-09 ENCOUNTER — Other Ambulatory Visit: Payer: Self-pay | Admitting: *Deleted

## 2019-10-09 ENCOUNTER — Encounter: Payer: Self-pay | Admitting: *Deleted

## 2019-10-09 NOTE — Patient Outreach (Signed)
[facility]) Care Management  10/09/2019  Donna Vega 12/16/1965 [id number]   Transition of care call/case closure   Referral received:09/29/19 Initial outreach:10/09/19 Insurance: [employer]    Subjective: Initial successful telephone call to patient's preferred number in order to complete transition of care assessment; 2 HIPAA identifiers verified. Explained purpose of call and completed transition of care assessment.   States she is doing okay reports that her pain is a lot better  managed with prescribed medications,  She reports tolerating low fat diet, not eating much due to little appetite but continues to try , occasional nausea , no vomiting, tolerating adequate fluid intake.  She  denies bowel or bladder problems.  Spouse/children are assisting with her recovery.  She denies any ongoing health issues and says she does not need a referral to one of the [place] chronic disease management programs.  She says she does not have the hospital indemnity. She has made contact regarding her FMLA . She discussed anticipated plan for gallbladder surgery in the near future she will know more after her visit with surgeon on this week.  She says she uses a [facility] outpatient pharmacy.  She denies educational needs related to staying safe during the COVID 19 pandemic.    Objective:  Donna Vega  was hospitalized at [facility] from 11/19-11/25 /2020 for Acute gallstone Pancreatitis  Comorbidities include: GERD, depression  She was discharged to home on 10/04/19  without the need for home health services or DME.   Assessment:  Patient voices good understanding of all discharge instructions.  See transition of care flowsheet for assessment details.   Plan:  Reviewed hospital discharge diagnosis of acute gallstone pancreatits  and discharge treatment plan using hospital discharge instructions, assessing medication adherence, reviewing problems requiring provider  notification, and discussing the importance of follow up with surgeon, primary care provider and/or specialists as directed. Reviewed [employer] announcements that all [place] members will receive the Healthy Lifestyle Premium rate in 2021.  No ongoing care management needs identified so will close case to [facility] services and route successful outreach letter with [place] [facility] pamphlet and 24 Hour Nurse Line Magnet to [facility] clinical pool to be mailed to patient's home address.    Joylene Draft, RN, [place] Management Coordinator  [phone]- Mobile [phone]- Toll Free Main Office

## 2019-10-12 ENCOUNTER — Ambulatory Visit (INDEPENDENT_AMBULATORY_CARE_PROVIDER_SITE_OTHER): Payer: 59 | Admitting: General Surgery

## 2019-10-12 ENCOUNTER — Other Ambulatory Visit: Payer: Self-pay

## 2019-10-12 ENCOUNTER — Encounter: Payer: Self-pay | Admitting: General Surgery

## 2019-10-12 VITALS — BP 147/94 | HR 88 | Temp 96.4°F | Resp 14 | Ht 68.0 in | Wt 205.0 lb

## 2019-10-12 DIAGNOSIS — K851 Biliary acute pancreatitis without necrosis or infection: Secondary | ICD-10-CM

## 2019-10-12 NOTE — Progress Notes (Signed)
[facility] History and Physical  Reason for Referral: Gallstone Pancreatitis  Referring Physician: Hospital Follow up  Chief Complaint    Follow-up      Donna Vega is a 53 y.o. female.  HPI: Donna Vega is a sweet 53 yo who is otherwise healthy and presented to the hospital 2 weeks ago with acute onset of abdominal pain and findings of gallstone pancreatitis. She was admitted and underwent ERCP with a balloon sweep and sphincterotomy but no stone was found. She remained in the hospital after this and had continued pain and tenderness. She was unable to really advance her diet, and repeat imaging demonstrated continued inflammation and acute pancreatitis but no necrosis or pseudocyst formation. Given the extent of the inflammation, I opted to hold on the cholecystectomy as an inpatient and we discussed the risk of recurrence pancreatitis but that the sphincterotomy should minimize this from occurring.   Since her discharge last week, she has been feeling better. Her abdominal pain is improving and she still has some back pain. Overall she rates her pain at a 3. She is eating and drinking, and having no nausea or vomiting. She is having regular Bms. She denies any pain or increased pain food intake.   Past Medical History:  Diagnosis Date  . Depression   . Eating disorder   . GERD (gastroesophageal reflux disease)   . Melanoma ([place])    left leg    Past Surgical History:  Procedure Laterality Date  . ERCP N/A 09/29/2019   Procedure: ENDOSCOPIC RETROGRADE CHOLANGIOPANCREATOGRAPHY (ERCP) WITH SPHINCTEROTOMY AND STONE EXTRACTION;  Surgeon: Rogene Houston, MD;  Location: [facility] ENDO [facility]ITE;  Service: Endoscopy;  Laterality: N/A;  . TUBAL LIGATION  1997    Family History  Problem Relation Age of Onset  . Colon cancer Mother 33       colon   . Hypertension Father   . Hyperlipidemia Father   . Liver disease Neg Hx     Social History   Tobacco Use  . Smoking  status: Never Smoker  . Smokeless tobacco: Never Used  Substance Use Topics  . Alcohol use: Not Currently    Comment: 2-3 beers after each evening for 6 years  . Drug use: No    Medications: I have reviewed the patient's current medications. Allergies as of 10/12/2019   No Known Allergies     Medication List       Accurate as of October 12, 2019  1:29 PM. If you have any questions, ask your nurse or doctor.        acetaminophen 325 MG tablet Commonly known as: TYLENOL Take 2 tablets (650 mg total) by mouth every 6 (six) hours as needed for mild pain or headache (or Fever >/= 101).   ondansetron 8 MG disintegrating tablet Commonly known as: Zofran ODT Take 0.5 tablets (4 mg total) by mouth every 8 (eight) hours as needed for nausea or vomiting.   pantoprazole 40 MG tablet Commonly known as: PROTONIX Take 1 tablet (40 mg total) by mouth daily.   PARoxetine 20 MG tablet Commonly known as: PAXIL Take 20 mg by mouth daily.        ROS:  A comprehensive review of systems was negative except for: Gastrointestinal: positive for abdominal pain and pain radiating to back  Blood pressure (!) 147/94, pulse 88, temperature (!) 96.4 F (35.8 C), temperature source Temporal, resp. rate 14, height 5\' 8"  (1.727 m), weight 205 lb (93 kg), SpO2 98 %.  Physical Exam Vitals signs reviewed.  Constitutional:      Appearance: Normal appearance.  HENT:     Head: Normocephalic and atraumatic.     Nose: Nose normal.     Mouth/Throat:     Mouth: Mucous membranes are moist.  Eyes:     Extraocular Movements: Extraocular movements intact.     Pupils: Pupils are equal, round, and reactive to light.  Neck:     Musculoskeletal: Normal range of motion. No neck rigidity.  Cardiovascular:     Rate and Rhythm: Normal rate and regular rhythm.  Pulmonary:     Effort: Pulmonary effort is normal.     Breath sounds: Normal breath sounds.  Abdominal:     General: There is no distension.      Palpations: Abdomen is soft.     Tenderness: There is abdominal tenderness in the epigastric area.  Musculoskeletal: Normal range of motion.        General: No swelling.  Skin:    General: Skin is warm and dry.  Neurological:     General: No focal deficit present.     Mental Status: She is alert and oriented to person, place, and time.  Psychiatric:        Mood and Affect: Mood normal.        Behavior: Behavior normal.        Thought Content: Thought content normal.        Judgment: Judgment normal.     Results: Personally reviewed  CT a/p - inflammation extending around pancreas/ thin contracted gallbladder   IMPRESSION: 1. Acute pancreatitis with extensive inflammation and fluid tracking from the lesser sac. No pancreatic necrosis. No organized or rim enhancing fluid collection. 2. Resolved biliary and gallbladder dilatation. 3. New moderate left and small right layering pleural effusions with lung base atelectasis. New pelvic free fluid. 4. Large bowel diverticulosis. Aortic Atherosclerosis (ICD10-I70.0).  Assessment & Plan:  Donna Vega is a 53 y.o. female with resolving pancreatitis. She is at home and eating. Her pain is improving. She is doing better now than she was last week. She needs to get her gallbladder out due to the gallstone pancreatitis and risk of recurrence.   PLAN: I counseled the patient about the indication, risks and benefits of laparoscopic cholecystectomy.  She understands there is a very small chance for bleeding, infection, injury to normal structures (including common bile duct), conversion to open surgery, persistent symptoms, evolution of postcholecystectomy diarrhea, need for secondary interventions, anesthesia reaction, cardiopulmonary issues and other risks not specifically detailed here. I described the expected recovery, the plan for follow-up and the restrictions during the recovery phase.  All questions were answered.  Discussed risk of  recurrent pancreatitis, pseudocyst formation, stricture from pancreatitis. Discussed additional risk of needing open surgery.   -Want to wait a few more weeks before surgery. Will plan for 12/16 which will be 3 weeks after her discharge.  -GI has filled out FMLA to last until 12/21. This should be fine for her going back to work after surgery, but if she needs more time we can extend it at that time.   All questions were answered to the satisfaction of the patient. I spent 25 minutes with the patient discussing the surgery, the risk with surgery, her pancreatitis, and how she is feeling.     Virl Cagey 10/12/2019, 1:29 PM

## 2019-10-12 NOTE — Patient Instructions (Addendum)
Laparoscopic Cholecystectomy Laparoscopic cholecystectomy is surgery to remove the gallbladder. The gallbladder is a pear-shaped organ that lies beneath the liver on the right side of the body. The gallbladder stores bile, which is a fluid that helps the body to digest fats. Cholecystectomy is often done for inflammation of the gallbladder (cholecystitis). This condition is usually caused by a buildup of gallstones (cholelithiasis) in the gallbladder. Gallstones can block the flow of bile, which can result in inflammation and pain. In severe cases, emergency surgery may be required. This procedure is done though small incisions in your abdomen (laparoscopic surgery). A thin scope with a camera (laparoscope) is inserted through one incision. Thin surgical instruments are inserted through the other incisions. In some cases, a laparoscopic procedure may be turned into a type of surgery that is done through a larger incision (open surgery). Tell a health care provider about:  Any allergies you have.  All medicines you are taking, including vitamins, herbs, eye drops, creams, and over-the-counter medicines.  Any problems you or family members have had with anesthetic medicines.  Any blood disorders you have.  Any surgeries you have had.  Any medical conditions you have.  Whether you are pregnant or may be pregnant. What are the risks? Generally, this is a safe procedure. However, problems may occur, including:  Infection.  Bleeding.  Allergic reactions to medicines.  Damage to other structures or organs.  A stone remaining in the common bile duct. The common bile duct carries bile from the gallbladder into the small intestine.  A bile leak from the cyst duct that is clipped when your gallbladder is removed. Medicines  Ask your health care provider about: ? Changing or stopping your regular medicines. This is especially important if you are taking diabetes medicines or blood  thinners. ? Taking medicines such as aspirin and ibuprofen. These medicines can thin your blood. Do not take these medicines before your procedure if your health care provider instructs you not to.  You may be given antibiotic medicine to help prevent infection. General instructions  Let your health care provider know if you develop a cold or an infection before surgery.  Plan to have someone take you home from the hospital or clinic.  Ask your health care provider how your surgical site will be marked or identified. What happens during the procedure?   To reduce your risk of infection: ? Your health care team will wash or sanitize their hands. ? Your skin will be washed with soap. ? Hair may be removed from the surgical area.  An IV tube may be inserted into one of your veins.  You will be given one or more of the following: ? A medicine to help you relax (sedative). ? A medicine to make you fall asleep (general anesthetic).  A breathing tube will be placed in your mouth.  Your surgeon will make several small cuts (incisions) in your abdomen.  The laparoscope will be inserted through one of the small incisions. The camera on the laparoscope will send images to a TV screen (monitor) in the operating room. This lets your surgeon see inside your abdomen.  Air-like gas will be pumped into your abdomen. This will expand your abdomen to give the surgeon more room to perform the surgery.  Other tools that are needed for the procedure will be inserted through the other incisions. The gallbladder will be removed through one of the incisions.  Your common bile duct may be examined. If stones are  found in the common bile duct, they may be removed.  After your gallbladder has been removed, the incisions will be closed with stitches (sutures), staples, or skin glue.  Your incisions may be covered with a bandage (dressing). The procedure may vary among health care providers and hospitals.  What happens after the procedure?  Your blood pressure, heart rate, breathing rate, and blood oxygen level will be monitored until the medicines you were given have worn off.  You will be given medicines as needed to control your pain.  Do not drive for 24 hours if you were given a sedative. This information is not intended to replace advice given to you by your health care provider. Make sure you discuss any questions you have with your health care provider. Document Released: 10/26/2005 Document Revised: 10/08/2017 Document Reviewed: 04/13/2016 Elsevier Patient Education  2020 [employer].  Cholelithiasis  Cholelithiasis is a form of gallbladder disease in which gallstones form in the gallbladder. The gallbladder is an organ that stores bile. Bile is made in the liver, and it helps to digest fats. Gallstones begin as small crystals and slowly grow into stones. They may cause no symptoms until the gallbladder tightens (contracts) and a gallstone is blocking the duct (gallbladder attack), which can cause pain. Cholelithiasis is also referred to as gallstones. There are two main types of gallstones:  Cholesterol stones. These are made of hardened cholesterol and are usually yellow-green in color. They are the most common type of gallstone. Cholesterol is a white, waxy, fat-like substance that is made in the liver.  Pigment stones. These are dark in color and are made of a red-yellow substance that forms when hemoglobin from red blood cells breaks down (bilirubin). What are the causes? This condition may be caused by an imbalance in the substances that bile is made of. This can happen if the bile:  Has too much bilirubin.  Has too much cholesterol.  Does not have enough bile salts. These salts help the body absorb and digest fats. In some cases, this condition can also be caused by the gallbladder not emptying completely or often enough. What increases the risk? The following factors  may make you more likely to develop this condition:  Being female.  Having multiple pregnancies. Health care providers sometimes advise removing diseased gallbladders before future pregnancies.  Eating a diet that is heavy in fried foods, fat, and refined carbohydrates, like white bread and white rice.  Being obese.  Being older than age 51.  Prolonged use of medicines that contain female hormones (estrogen).  Having diabetes mellitus.  Rapidly losing weight.  Having a family history of gallstones.  Being of [place] or [place] descent.  Having an intestinal disease such as Crohn disease.  Having metabolic syndrome.  Having cirrhosis.  Having severe types of anemia such as sickle cell anemia. What are the signs or symptoms? In most cases, there are no symptoms. These are known as silent gallstones. If a gallstone blocks the bile ducts, it can cause a gallbladder attack. The main symptom of a gallbladder attack is sudden pain in the upper right abdomen. The pain usually comes at night or after eating a large meal. The pain can last for one or several hours and can spread to the right shoulder or chest. If the bile duct is blocked for more than a few hours, it can cause infection or inflammation of the gallbladder, liver, or pancreas, which may cause:  Nausea.  Vomiting.  Abdominal pain  that lasts for 5 hours or more.  Fever or chills.  Yellowing of the skin or the whites of the eyes (jaundice).  Dark urine.  Light-colored stools. How is this diagnosed? This condition may be diagnosed based on:  A physical exam.  Your medical history.  An ultrasound of your gallbladder.  CT scan.  MRI.  Blood tests to check for signs of infection or inflammation.  A scan of your gallbladder and bile ducts (biliary system) using nonharmful radioactive material and special cameras that can see the radioactive material (cholescintigram). This test checks to see how your  gallbladder contracts and whether bile ducts are blocked.  Inserting a small tube with a camera on the end (endoscope) through your mouth to inspect bile ducts and check for blockages (endoscopic retrograde cholangiopancreatogram). How is this treated? Treatment for gallstones depends on the severity of the condition. Silent gallstones do not need treatment. If the gallstones cause a gallbladder attack or other symptoms, treatment may be required. Options for treatment include:  Surgery to remove the gallbladder (cholecystectomy). This is the most common treatment.  Medicines to dissolve gallstones. These are most effective at treating small gallstones. You may need to take medicines for up to 6-12 months.  Shock wave treatment (extracorporeal biliary lithotripsy). In this treatment, an ultrasound machine sends shock waves to the gallbladder to break gallstones into smaller pieces. These pieces can then be passed into the intestines or be dissolved by medicine. This is rarely used.  Removing gallstones through endoscopic retrograde cholangiopancreatogram. A small basket can be attached to the endoscope and used to capture and remove gallstones. Follow these instructions at home:  Take over-the-counter and prescription medicines only as told by your health care provider.  Maintain a healthy weight and follow a healthy diet. This includes: ? Reducing fatty foods, such as fried food. ? Reducing refined carbohydrates, like white bread and white rice. ? Increasing fiber. Aim for foods like almonds, fruit, and beans.  Keep all follow-up visits as told by your health care provider. This is important. Contact a health care provider if:  You think you have had a gallbladder attack.  You have been diagnosed with silent gallstones and you develop abdominal pain or indigestion. Get help right away if:  You have pain from a gallbladder attack that lasts for more than 2 hours.  You have abdominal  pain that lasts for more than 5 hours.  You have a fever or chills.  You have persistent nausea and vomiting.  You develop jaundice.  You have dark urine or light-colored stools. Summary  Cholelithiasis (also called gallstones) is a form of gallbladder disease in which gallstones form in the gallbladder.  This condition is caused by an imbalance in the substances that make up bile. This can happen if the bile has too much cholesterol, too much bilirubin, or not enough bile salts.  You are more likely to develop this condition if you are female, pregnant, using medicines with estrogen, obese, older than age 48, or have a family history of gallstones. You may also develop gallstones if you have diabetes, an intestinal disease, cirrhosis, or metabolic syndrome.  Treatment for gallstones depends on the severity of the condition. Silent gallstones do not need treatment.  If gallstones cause a gallbladder attack or other symptoms, treatment may be needed. The most common treatment is surgery to remove the gallbladder. This information is not intended to replace advice given to you by your health care provider. Make sure you  discuss any questions you have with your health care provider. Document Released: 10/22/2005 Document Revised: 10/08/2017 Document Reviewed: 07/12/2016 Elsevier Patient Education  2020 [employer].  Acute Pancreatitis  The pancreas is a gland that is located behind the stomach on the left side of the abdomen. It produces enzymes that help to digest food. The pancreas also releases the hormones glucagon and insulin, which help to regulate blood sugar. Acute pancreatitis happens when inflammation of the pancreas suddenly occurs and the pancreas becomes irritated and swollen. Most acute attacks last a few days and cause serious problems. Some people become dehydrated and develop low blood pressure. In severe cases, bleeding in the abdomen can lead to shock and can be  life-threatening. The lungs, heart, and kidneys may fail. What are the causes? This condition may be caused by:  Alcohol abuse.  Drug abuse.  Gallstones or other conditions that can block the tube that drains the pancreas (pancreatic duct).  A tumor in the pancreas. Other causes include:  Certain medicines.  Exposure to certain chemicals.  Diabetes.  An infection in the pancreas.  Damage caused by an accident (trauma).  The poison (venom) from a scorpion bite.  Abdominal surgery.  Autoimmune pancreatitis. This is when the body's disease-fighting (immune) system attacks the pancreas.  Genes that are passed from parent to child (inherited). In some cases, the cause of this condition is not known. What are the signs or symptoms? Symptoms of this condition include:  Pain in the upper abdomen that may radiate to the back. Pain may be severe.  Tenderness and swelling of the abdomen.  Nausea and vomiting.  Fever. How is this diagnosed? This condition may be diagnosed based on:  A physical exam.  Blood tests.  Imaging tests, such as X-rays, CT or MRI scans, or an ultrasound of the abdomen. How is this treated? Treatment for this condition usually requires a stay in the hospital. Treatment for this condition may include:  Pain medicine.  Fluid replacement through an IV.  Placing a tube in the stomach to remove stomach contents and to control vomiting (NG tube, or nasogastric tube).  Not eating for 3-4 days. This gives the pancreas a rest, because enzymes are not being produced that can cause further damage.  Antibiotic medicines, if your condition is caused by an infection.  Treating any underlying conditions that may be the cause.  Steroid medicines, if your condition is caused by your immune system attacking your body's own tissues (autoimmune disease).  Surgery on the pancreas or gallbladder. Follow these instructions at home: Eating and drinking    Follow instructions from your health care provider about diet. This may involve avoiding alcohol and decreasing the amount of fat in your diet.  Eat smaller, more frequent meals. This reduces the amount of digestive fluids that the pancreas produces.  Drink enough fluid to keep your urine pale yellow.  Do not drink alcohol if it caused your condition. General instructions  Take over-the-counter and prescription medicines only as told by your health care provider.  Do not drive or use heavy machinery while taking prescription pain medicine.  Ask your health care provider if the medicine prescribed to you can cause constipation. You may need to take steps to prevent or treat constipation, such as: ? Take an over-the-counter or prescription medicine for constipation. ? Eat foods that are high in fiber such as whole grains and beans. ? Limit foods that are high in fat and processed sugars, such as fried  or sweet foods.  Do not use any products that contain nicotine or tobacco, such as cigarettes, e-cigarettes, and chewing tobacco. If you need help quitting, ask your health care provider.  Get plenty of rest.  If directed, check your blood sugar at home as told by your health care provider.  Keep all follow-up visits as told by your health care provider. This is important. Contact a health care provider if you:  Do not recover as quickly as expected.  Develop new or worsening symptoms.  Have persistent pain, weakness, or nausea.  Recover and then have another episode of pain.  Have a fever. Get help right away if:  You cannot eat or keep fluids down.  Your pain becomes severe.  Your skin or the white part of your eyes turns yellow (jaundice).  You have sudden swelling in your abdomen.  You vomit.  You feel dizzy or you faint.  Your blood sugar is high (over 300 mg/dL). Summary  Acute pancreatitis happens when inflammation of the pancreas suddenly occurs and the  pancreas becomes irritated and swollen.  This condition is typically caused by alcohol abuse, drug abuse, or gallstones.  Treatment for this condition usually requires a stay in the hospital. This information is not intended to replace advice given to you by your health care provider. Make sure you discuss any questions you have with your health care provider. Document Released: 10/26/2005 Document Revised: 08/15/2018 Document Reviewed: 05/02/2018 Elsevier Patient Education  2020 [employer].

## 2019-10-16 NOTE — H&P (Signed)
[facility] History and Physical  Reason for Referral: Gallstone Pancreatitis  Referring Physician: Hospital Follow up     Chief Complaint    Follow-up      Donna Vega is a 53 y.o. female.  HPI: Donna Vega is a sweet 53 yo who is otherwise healthy and presented to the hospital 2 weeks ago with acute onset of abdominal pain and findings of gallstone pancreatitis. She was admitted and underwent ERCP with a balloon sweep and sphincterotomy but no stone was found. She remained in the hospital after this and had continued pain and tenderness. She was unable to really advance her diet, and repeat imaging demonstrated continued inflammation and acute pancreatitis but no necrosis or pseudocyst formation. Given the extent of the inflammation, I opted to hold on the cholecystectomy as an inpatient and we discussed the risk of recurrence pancreatitis but that the sphincterotomy should minimize this from occurring.   Since her discharge last week, she has been feeling better. Her abdominal pain is improving and she still has some back pain. Overall she rates her pain at a 3. She is eating and drinking, and having no nausea or vomiting. She is having regular Bms. She denies any pain or increased pain food intake.       Past Medical History:  Diagnosis Date  . Depression   . Eating disorder   . GERD (gastroesophageal reflux disease)   . Melanoma ([place])    left leg    Past Surgical History:  Procedure Laterality Date  . ERCP N/A 09/29/2019   Procedure: ENDOSCOPIC RETROGRADE CHOLANGIOPANCREATOGRAPHY (ERCP) WITH SPHINCTEROTOMY AND STONE EXTRACTION;  Surgeon: Rogene Houston, MD;  Location: [facility] ENDO SUITE;  Service: Endoscopy;  Laterality: N/A;  . TUBAL LIGATION  1997         Family History  Problem Relation Age of Onset  . Colon cancer Mother 41       colon   . Hypertension Father   . Hyperlipidemia Father   . Liver disease Neg Hx     Social  History   Tobacco Use  . Smoking status: Never Smoker  . Smokeless tobacco: Never Used  Substance Use Topics  . Alcohol use: Not Currently    Comment: 2-3 beers after each evening for 6 years  . Drug use: No    Medications: I have reviewed the patient's current medications. Allergies as of 10/12/2019   No Known Allergies        Medication List       Accurate as of October 12, 2019  1:29 PM. If you have any questions, ask your nurse or doctor.        acetaminophen 325 MG tablet Commonly known as: TYLENOL Take 2 tablets (650 mg total) by mouth every 6 (six) hours as needed for mild pain or headache (or Fever >/= 101).   ondansetron 8 MG disintegrating tablet Commonly known as: Zofran ODT Take 0.5 tablets (4 mg total) by mouth every 8 (eight) hours as needed for nausea or vomiting.   pantoprazole 40 MG tablet Commonly known as: PROTONIX Take 1 tablet (40 mg total) by mouth daily.   PARoxetine 20 MG tablet Commonly known as: PAXIL Take 20 mg by mouth daily.        ROS:  A comprehensive review of systems was negative except for: Gastrointestinal: positive for abdominal pain and pain radiating to back  Blood pressure (!) 147/94, pulse 88, temperature (!) 96.4 F (35.8 C), temperature source Temporal,  resp. rate 14, height 5\' 8"  (1.727 m), weight 205 lb (93 kg), SpO2 98 %. Physical Exam Vitals signs reviewed.  Constitutional:      Appearance: Normal appearance.  HENT:     Head: Normocephalic and atraumatic.     Nose: Nose normal.     Mouth/Throat:     Mouth: Mucous membranes are moist.  Eyes:     Extraocular Movements: Extraocular movements intact.     Pupils: Pupils are equal, round, and reactive to light.  Neck:     Musculoskeletal: Normal range of motion. No neck rigidity.  Cardiovascular:     Rate and Rhythm: Normal rate and regular rhythm.  Pulmonary:     Effort: Pulmonary effort is normal.     Breath sounds: Normal breath sounds.   Abdominal:     General: There is no distension.     Palpations: Abdomen is soft.     Tenderness: There is abdominal tenderness in the epigastric area.  Musculoskeletal: Normal range of motion.        General: No swelling.  Skin:    General: Skin is warm and dry.  Neurological:     General: No focal deficit present.     Mental Status: She is alert and oriented to person, place, and time.  Psychiatric:        Mood and Affect: Mood normal.        Behavior: Behavior normal.        Thought Content: Thought content normal.        Judgment: Judgment normal.     Results: Personally reviewed  CT a/p - inflammation extending around pancreas/ thin contracted gallbladder   IMPRESSION: 1. Acute pancreatitis with extensive inflammation and fluid tracking from the lesser sac. No pancreatic necrosis. No organized or rim enhancing fluid collection. 2. Resolved biliary and gallbladder dilatation. 3. New moderate left and small right layering pleural effusions with lung base atelectasis. New pelvic free fluid. 4. Large bowel diverticulosis. Aortic Atherosclerosis (ICD10-I70.0).  Assessment & Plan:  Donna Vega is a 53 y.o. female with resolving pancreatitis. She is at home and eating. Her pain is improving. She is doing better now than she was last week. She needs to get her gallbladder out due to the gallstone pancreatitis and risk of recurrence.   PLAN: I counseled the patient about the indication, risks and benefits of laparoscopic cholecystectomy.  She understands there is a very small chance for bleeding, infection, injury to normal structures (including common bile duct), conversion to open surgery, persistent symptoms, evolution of postcholecystectomy diarrhea, need for secondary interventions, anesthesia reaction, cardiopulmonary issues and other risks not specifically detailed here. I described the expected recovery, the plan for follow-up and the restrictions during the  recovery phase.  All questions were answered.  Discussed risk of recurrent pancreatitis, pseudocyst formation, stricture from pancreatitis. Discussed additional risk of needing open surgery.   -Want to wait a few more weeks before surgery. Will plan for 12/16 which will be 3 weeks after her discharge.  -GI has filled out FMLA to last until 12/21. This should be fine for her going back to work after surgery, but if she needs more time we can extend it at that time.   All questions were answered to the satisfaction of the patient. I spent 25 minutes with the patient discussing the surgery, the risk with surgery, her pancreatitis, and how she is feeling.     Virl Cagey 10/12/2019, 1:29 PM

## 2019-10-20 NOTE — Patient Instructions (Signed)
Donna Vega  10/20/2019     @PREFPERIOPPHARMACY @   Your procedure is scheduled on  10/25/2019   Report to [facility] at  [email or web address]  Call this number if you have problems the morning of surgery:  [phone]   Remember:  Do not eat or drink after midnight.                         Take these medicines the morning of surgery with A SIP OF WATER  Zofran(if needed), protonix, paxil.    Do not wear jewelry, make-up or nail polish.  Do not wear lotions, powders, or perfumes. Please wear deodorant and brush your teeth,  Do not shave 48 hours prior to surgery.  Men may shave face and neck.  Do not bring valuables to the hospital.  [facility] is not responsible for any belongings or valuables.  Contacts, dentures or bridgework may not be worn into surgery.  Leave your suitcase in the car.  After surgery it may be brought to your room.  For patients admitted to the hospital, discharge time will be determined by your treatment team.  Patients discharged the day of surgery will not be allowed to drive home.   Name and phone number of your driver:   family Special instructions:  None  Please read over the following fact sheets that you were given. Anesthesia Post-op Instructions and Care and Recovery After Surgery       Laparoscopic Cholecystectomy, Care After This sheet gives you information about how to care for yourself after your procedure. Your health care provider may also give you more specific instructions. If you have problems or questions, contact your health care provider. What can I expect after the procedure? After the procedure, it is common to have:  Pain at your incision sites. You will be given medicines to control this pain.  Mild nausea or vomiting.  Bloating and possible shoulder pain from the air-like gas that was used during the procedure. Follow these instructions at home: Incision care   Follow instructions from your health care  provider about how to take care of your incisions. Make sure you: ? Wash your hands with soap and water before you change your bandage (dressing). If soap and water are not available, use hand sanitizer. ? Change your dressing as told by your health care provider. ? Leave stitches (sutures), skin glue, or adhesive strips in place. These skin closures may need to be in place for 2 weeks or longer. If adhesive strip edges start to loosen and curl up, you may trim the loose edges. Do not remove adhesive strips completely unless your health care provider tells you to do that.  Do not take baths, swim, or use a hot tub until your health care provider approves. Ask your health care provider if you can take showers. You may only be allowed to take sponge baths for bathing.  Check your incision area every day for signs of infection. Check for: ? More redness, swelling, or pain. ? More fluid or blood. ? Warmth. ? Pus or a bad smell. Activity  Do not drive or use heavy machinery while taking prescription pain medicine.  Do not lift anything that is heavier than 10 lb (4.5 kg) until your health care provider approves.  Do not play contact sports until your health care provider approves.  Do not drive for 24 hours if  you were given a medicine to help you relax (sedative).  Rest as needed. Do not return to work or school until your health care provider approves. General instructions  Take over-the-counter and prescription medicines only as told by your health care provider.  To prevent or treat constipation while you are taking prescription pain medicine, your health care provider may recommend that you: ? Drink enough fluid to keep your urine clear or pale yellow. ? Take over-the-counter or prescription medicines. ? Eat foods that are high in fiber, such as fresh fruits and vegetables, whole grains, and beans. ? Limit foods that are high in fat and processed sugars, such as fried and sweet  foods. Contact a health care provider if:  You develop a rash.  You have more redness, swelling, or pain around your incisions.  You have more fluid or blood coming from your incisions.  Your incisions feel warm to the touch.  You have pus or a bad smell coming from your incisions.  You have a fever.  One or more of your incisions breaks open. Get help right away if:  You have trouble breathing.  You have chest pain.  You have increasing pain in your shoulders.  You faint or feel dizzy when you stand.  You have severe pain in your abdomen.  You have nausea or vomiting that lasts for more than one day.  You have leg pain. This information is not intended to replace advice given to you by your health care provider. Make sure you discuss any questions you have with your health care provider. Document Released: 10/26/2005 Document Revised: 10/08/2017 Document Reviewed: 04/13/2016 Elsevier Patient Education  2020 [employer] Anesthesia, Adult, Care After This sheet gives you information about how to care for yourself after your procedure. Your health care provider may also give you more specific instructions. If you have problems or questions, contact your health care provider. What can I expect after the procedure? After the procedure, the following side effects are common:  Pain or discomfort at the IV site.  Nausea.  Vomiting.  Sore throat.  Trouble concentrating.  Feeling cold or chills.  Weak or tired.  Sleepiness and fatigue.  Soreness and body aches. These side effects can affect parts of the body that were not involved in surgery. Follow these instructions at home:  For at least 24 hours after the procedure:  Have a responsible adult stay with you. It is important to have someone help care for you until you are awake and alert.  Rest as needed.  Do not: ? Participate in activities in which you could fall or become  injured. ? Drive. ? Use heavy machinery. ? Drink alcohol. ? Take sleeping pills or medicines that cause drowsiness. ? Make important decisions or sign legal documents. ? Take care of children on your own. Eating and drinking  Follow any instructions from your health care provider about eating or drinking restrictions.  When you feel hungry, start by eating small amounts of foods that are soft and easy to digest (bland), such as toast. Gradually return to your regular diet.  Drink enough fluid to keep your urine pale yellow.  If you vomit, rehydrate by drinking water, juice, or clear broth. General instructions  If you have sleep apnea, surgery and certain medicines can increase your risk for breathing problems. Follow instructions from your health care provider about wearing your sleep device: ? Anytime you are sleeping, including during daytime naps. ? While taking  prescription pain medicines, sleeping medicines, or medicines that make you drowsy.  Return to your normal activities as told by your health care provider. Ask your health care provider what activities are safe for you.  Take over-the-counter and prescription medicines only as told by your health care provider.  If you smoke, do not smoke without supervision.  Keep all follow-up visits as told by your health care provider. This is important. Contact a health care provider if:  You have nausea or vomiting that does not get better with medicine.  You cannot eat or drink without vomiting.  You have pain that does not get better with medicine.  You are unable to pass urine.  You develop a skin rash.  You have a fever.  You have redness around your IV site that gets worse. Get help right away if:  You have difficulty breathing.  You have chest pain.  You have blood in your urine or stool, or you vomit blood. Summary  After the procedure, it is common to have a sore throat or nausea. It is also common to  feel tired.  Have a responsible adult stay with you for the first 24 hours after general anesthesia. It is important to have someone help care for you until you are awake and alert.  When you feel hungry, start by eating small amounts of foods that are soft and easy to digest (bland), such as toast. Gradually return to your regular diet.  Drink enough fluid to keep your urine pale yellow.  Return to your normal activities as told by your health care provider. Ask your health care provider what activities are safe for you. This information is not intended to replace advice given to you by your health care provider. Make sure you discuss any questions you have with your health care provider. Document Released: 02/01/2001 Document Revised: 10/29/2017 Document Reviewed: 06/11/2017 Elsevier Patient Education  2020 [employer]. How to Use Chlorhexidine for Bathing Chlorhexidine gluconate (CHG) is a germ-killing (antiseptic) solution that is used to clean the skin. It can get rid of the bacteria that normally live on the skin and can keep them away for about 24 hours. To clean your skin with CHG, you may be given:  A CHG solution to use in the shower or as part of a sponge bath.  A prepackaged cloth that contains CHG. Cleaning your skin with CHG may help lower the risk for infection:  While you are staying in the intensive care unit of the hospital.  If you have a vascular access, such as a central line, to provide short-term or long-term access to your veins.  If you have a catheter to drain urine from your bladder.  If you are on a ventilator. A ventilator is a machine that helps you breathe by moving air in and out of your lungs.  After surgery. What are the risks? Risks of using CHG include:  A skin reaction.  Hearing loss, if CHG gets in your ears.  Eye injury, if CHG gets in your eyes and is not rinsed out.  The CHG product catching fire. Make sure that you avoid smoking and  flames after applying CHG to your skin. Do not use CHG:  If you have a chlorhexidine allergy or have previously reacted to chlorhexidine.  On babies younger than 39 months of age. How to use CHG solution  Use CHG only as told by your health care provider, and follow the instructions on the label.  Use the full amount of CHG as directed. Usually, this is one bottle. During a shower Follow these steps when using CHG solution during a shower (unless your health care provider gives you different instructions): 1. Start the shower. 2. Use your normal soap and shampoo to wash your face and hair. 3. Turn off the shower or move out of the shower stream. 4. Pour the CHG onto a clean washcloth. Do not use any type of brush or rough-edged sponge. 5. Starting at your neck, lather your body down to your toes. Make sure you follow these instructions: ? If you will be having surgery, pay special attention to the part of your body where you will be having surgery. Scrub this area for at least 1 minute. ? Do not use CHG on your head or face. If the solution gets into your ears or eyes, rinse them well with water. ? Avoid your genital area. ? Avoid any areas of skin that have broken skin, cuts, or scrapes. ? Scrub your back and under your arms. Make sure to wash skin folds. 6. Let the lather sit on your skin for 1-2 minutes or as long as told by your health care provider. 7. Thoroughly rinse your entire body in the shower. Make sure that all body creases and crevices are rinsed well. 8. Dry off with a clean towel. Do not put any substances on your body afterward--such as powder, lotion, or perfume--unless you are told to do so by your health care provider. Only use lotions that are recommended by the manufacturer. 9. Put on clean clothes or pajamas. 10. If it is the night before your surgery, sleep in clean sheets.  During a sponge bath Follow these steps when using CHG solution during a sponge bath  (unless your health care provider gives you different instructions): 1. Use your normal soap and shampoo to wash your face and hair. 2. Pour the CHG onto a clean washcloth. 3. Starting at your neck, lather your body down to your toes. Make sure you follow these instructions: ? If you will be having surgery, pay special attention to the part of your body where you will be having surgery. Scrub this area for at least 1 minute. ? Do not use CHG on your head or face. If the solution gets into your ears or eyes, rinse them well with water. ? Avoid your genital area. ? Avoid any areas of skin that have broken skin, cuts, or scrapes. ? Scrub your back and under your arms. Make sure to wash skin folds. 4. Let the lather sit on your skin for 1-2 minutes or as long as told by your health care provider. 5. Using a different clean, wet washcloth, thoroughly rinse your entire body. Make sure that all body creases and crevices are rinsed well. 6. Dry off with a clean towel. Do not put any substances on your body afterward--such as powder, lotion, or perfume--unless you are told to do so by your health care provider. Only use lotions that are recommended by the manufacturer. 7. Put on clean clothes or pajamas. 8. If it is the night before your surgery, sleep in clean sheets. How to use CHG prepackaged cloths  Only use CHG cloths as told by your health care provider, and follow the instructions on the label.  Use the CHG cloth on clean, dry skin.  Do not use the CHG cloth on your head or face unless your health care provider tells you to.  When  washing with the CHG cloth: ? Avoid your genital area. ? Avoid any areas of skin that have broken skin, cuts, or scrapes. Before surgery Follow these steps when using a CHG cloth to clean before surgery (unless your health care provider gives you different instructions): 1. Using the CHG cloth, vigorously scrub the part of your body where you will be having  surgery. Scrub using a back-and-forth motion for 3 minutes. The area on your body should be completely wet with CHG when you are done scrubbing. 2. Do not rinse. Discard the cloth and let the area air-dry. Do not put any substances on the area afterward, such as powder, lotion, or perfume. 3. Put on clean clothes or pajamas. 4. If it is the night before your surgery, sleep in clean sheets.  For general bathing Follow these steps when using CHG cloths for general bathing (unless your health care provider gives you different instructions). 1. Use a separate CHG cloth for each area of your body. Make sure you wash between any folds of skin and between your fingers and toes. Wash your body in the following order, switching to a new cloth after each step: ? The front of your neck, shoulders, and chest. ? Both of your arms, under your arms, and your hands. ? Your stomach and groin area, avoiding the genitals. ? Your right leg and foot. ? Your left leg and foot. ? The back of your neck, your back, and your buttocks. 2. Do not rinse. Discard the cloth and let the area air-dry. Do not put any substances on your body afterward--such as powder, lotion, or perfume--unless you are told to do so by your health care provider. Only use lotions that are recommended by the manufacturer. 3. Put on clean clothes or pajamas. Contact a health care provider if:  Your skin gets irritated after scrubbing.  You have questions about using your solution or cloth. Get help right away if:  Your eyes become very red or swollen.  Your eyes itch badly.  Your skin itches badly and is red or swollen.  Your hearing changes.  You have trouble seeing.  You have swelling or tingling in your mouth or throat.  You have trouble breathing.  You swallow any chlorhexidine. Summary  Chlorhexidine gluconate (CHG) is a germ-killing (antiseptic) solution that is used to clean the skin. Cleaning your skin with CHG may help to  lower your risk for infection.  You may be given CHG to use for bathing. It may be in a bottle or in a prepackaged cloth to use on your skin. Carefully follow your health care provider's instructions and the instructions on the product label.  Do not use CHG if you have a chlorhexidine allergy.  Contact your health care provider if your skin gets irritated after scrubbing. This information is not intended to replace advice given to you by your health care provider. Make sure you discuss any questions you have with your health care provider. Document Released: 07/20/2012 Document Revised: 01/12/2019 Document Reviewed: 09/23/2017 Elsevier Patient Education  2020 [employer].

## 2019-10-23 ENCOUNTER — Encounter (HOSPITAL_COMMUNITY)
Admission: RE | Admit: 2019-10-23 | Discharge: 2019-10-23 | Disposition: A | Discharge status: Home / Self Care | Payer: 59 | Source: Ambulatory Visit | Attending: General Surgery | Admitting: General Surgery

## 2019-10-23 ENCOUNTER — Encounter (HOSPITAL_COMMUNITY): Payer: Self-pay

## 2019-10-23 ENCOUNTER — Other Ambulatory Visit (HOSPITAL_COMMUNITY)
Admission: RE | Admit: 2019-10-23 | Discharge: 2019-10-23 | Disposition: A | Discharge status: Home / Self Care | Payer: 59 | Source: Ambulatory Visit | Attending: General Surgery | Admitting: General Surgery

## 2019-10-23 ENCOUNTER — Other Ambulatory Visit: Payer: Self-pay

## 2019-10-23 DIAGNOSIS — Z01812 Encounter for preprocedural laboratory examination: Secondary | ICD-10-CM | POA: Insufficient documentation

## 2019-10-23 HISTORY — DX: Pure hypercholesterolemia, unspecified: E78.00

## 2019-10-23 LAB — HCG, SERUM, QUALITATIVE: Preg, Serum: NEGATIVE

## 2019-10-23 LAB — SARS CORONAVIRUS 2 (TAT 6-24 HRS): SARS Coronavirus 2: NEGATIVE

## 2019-10-24 ENCOUNTER — Telehealth: Payer: Self-pay | Admitting: General Surgery

## 2019-10-24 NOTE — Telephone Encounter (Signed)
[facility]  Weather possible bad tomorrow. Asked if she wanted to move to Thursday. She wants to proceed tomorrow. Told her to call in the AM if the roads or weather is worse than we think.  Curlene Labrum, MD [facility] [address] [place], [place] [id number] [phone] (office)

## 2019-10-25 ENCOUNTER — Encounter (HOSPITAL_COMMUNITY)
Admission: RE | Disposition: A | Discharge status: Home / Self Care | Payer: Self-pay | Source: Home / Self Care | Attending: General Surgery

## 2019-10-25 ENCOUNTER — Ambulatory Visit (HOSPITAL_COMMUNITY)
Admission: RE | Admit: 2019-10-25 | Discharge: 2019-10-25 | Disposition: A | Discharge status: Home / Self Care | Payer: 59 | Attending: General Surgery | Admitting: General Surgery

## 2019-10-25 ENCOUNTER — Ambulatory Visit (HOSPITAL_COMMUNITY): Payer: 59 | Admitting: Anesthesiology

## 2019-10-25 ENCOUNTER — Other Ambulatory Visit: Payer: Self-pay

## 2019-10-25 ENCOUNTER — Encounter (HOSPITAL_COMMUNITY): Payer: Self-pay | Admitting: General Surgery

## 2019-10-25 DIAGNOSIS — Z85820 Personal history of malignant melanoma of skin: Secondary | ICD-10-CM | POA: Insufficient documentation

## 2019-10-25 DIAGNOSIS — K219 Gastro-esophageal reflux disease without esophagitis: Secondary | ICD-10-CM | POA: Insufficient documentation

## 2019-10-25 DIAGNOSIS — K8010 Calculus of gallbladder with chronic cholecystitis without obstruction: Secondary | ICD-10-CM | POA: Insufficient documentation

## 2019-10-25 DIAGNOSIS — K8020 Calculus of gallbladder without cholecystitis without obstruction: Secondary | ICD-10-CM | POA: Diagnosis not present

## 2019-10-25 DIAGNOSIS — K859 Acute pancreatitis without necrosis or infection, unspecified: Secondary | ICD-10-CM | POA: Diagnosis not present

## 2019-10-25 DIAGNOSIS — Z79899 Other long term (current) drug therapy: Secondary | ICD-10-CM | POA: Insufficient documentation

## 2019-10-25 DIAGNOSIS — F329 Major depressive disorder, single episode, unspecified: Secondary | ICD-10-CM | POA: Insufficient documentation

## 2019-10-25 DIAGNOSIS — K851 Biliary acute pancreatitis without necrosis or infection: Secondary | ICD-10-CM | POA: Diagnosis present

## 2019-10-25 HISTORY — PX: CHOLECYSTECTOMY: SHX55

## 2019-10-25 SURGERY — LAPAROSCOPIC CHOLECYSTECTOMY
Anesthesia: General

## 2019-10-25 MED ORDER — HYDROMORPHONE HCL 1 MG/ML IJ SOLN
0.2500 mg | INTRAMUSCULAR | Status: DC | PRN
  Administered 2019-10-25: 0.5 mg via INTRAVENOUS
  Filled 2019-10-25: qty 0.5

## 2019-10-25 MED ORDER — HEMOSTATIC AGENTS (NO CHARGE) OPTIME
TOPICAL | Status: DC | PRN
  Administered 2019-10-25: 1 via TOPICAL

## 2019-10-25 MED ORDER — SODIUM CHLORIDE 0.9 % IV SOLN
2.0000 g | INTRAVENOUS | Status: AC
  Administered 2019-10-25: 09:00:00 2 g via INTRAVENOUS
  Filled 2019-10-25: qty 2

## 2019-10-25 MED ORDER — SUGAMMADEX SODIUM 200 MG/2ML IV SOLN
INTRAVENOUS | Status: DC | PRN
  Administered 2019-10-25: 185 mg via INTRAVENOUS

## 2019-10-25 MED ORDER — OXYCODONE HCL 5 MG PO TABS: 5.0000 mg | ORAL_TABLET | ORAL | 0 refills | Status: AC | PRN

## 2019-10-25 MED ORDER — GLYCOPYRROLATE PF 0.2 MG/ML IJ SOSY
PREFILLED_SYRINGE | INTRAMUSCULAR | Status: DC | PRN
  Administered 2019-10-25: .2 mg via INTRAVENOUS

## 2019-10-25 MED ORDER — PROMETHAZINE HCL 25 MG/ML IJ SOLN: 6.2500 mg | INTRAMUSCULAR | Status: DC | PRN

## 2019-10-25 MED ORDER — HYDROCODONE-ACETAMINOPHEN 7.5-325 MG PO TABS
1.0000 | ORAL_TABLET | Freq: Once | ORAL | Status: AC | PRN
  Administered 2019-10-25: 1 via ORAL
  Filled 2019-10-25: qty 1

## 2019-10-25 MED ORDER — SODIUM CHLORIDE 0.9 % IR SOLN
Status: DC | PRN
  Administered 2019-10-25: 1000 mL

## 2019-10-25 MED ORDER — ONDANSETRON HCL 4 MG/2ML IJ SOLN
INTRAMUSCULAR | Status: DC | PRN
  Administered 2019-10-25: 4 mg via INTRAVENOUS

## 2019-10-25 MED ORDER — DOCUSATE SODIUM 100 MG PO CAPS: 100.0000 mg | ORAL_CAPSULE | Freq: Two times a day (BID) | ORAL | 2 refills | Status: AC

## 2019-10-25 MED ORDER — ROCURONIUM BROMIDE 100 MG/10ML IV SOLN
INTRAVENOUS | Status: DC | PRN
  Administered 2019-10-25: 40 mg via INTRAVENOUS

## 2019-10-25 MED ORDER — FENTANYL CITRATE (PF) 250 MCG/5ML IJ SOLN
INTRAMUSCULAR | Status: AC
  Filled 2019-10-25: qty 5

## 2019-10-25 MED ORDER — CHLORHEXIDINE GLUCONATE CLOTH 2 % EX PADS: 6.0000 | MEDICATED_PAD | Freq: Once | CUTANEOUS | Status: DC

## 2019-10-25 MED ORDER — LACTATED RINGERS IV SOLN: INTRAVENOUS | Status: DC

## 2019-10-25 MED ORDER — BUPIVACAINE HCL (PF) 0.5 % IJ SOLN
INTRAMUSCULAR | Status: DC | PRN
  Administered 2019-10-25: 30 mL

## 2019-10-25 MED ORDER — LIDOCAINE HCL (CARDIAC) PF 100 MG/5ML IV SOSY
PREFILLED_SYRINGE | INTRAVENOUS | Status: DC | PRN
  Administered 2019-10-25: 80 mg via INTRAVENOUS

## 2019-10-25 MED ORDER — SODIUM CHLORIDE 0.9 % IV SOLN
INTRAVENOUS | Status: DC | PRN
  Administered 2019-10-25: 10:00:00 80 mL via INTRAVENOUS

## 2019-10-25 MED ORDER — PROPOFOL 10 MG/ML IV BOLUS
INTRAVENOUS | Status: AC
  Filled 2019-10-25: qty 40

## 2019-10-25 MED ORDER — SUCCINYLCHOLINE CHLORIDE 20 MG/ML IJ SOLN
INTRAMUSCULAR | Status: DC | PRN
  Administered 2019-10-25: 160 mg via INTRAVENOUS

## 2019-10-25 MED ORDER — BUPIVACAINE HCL (PF) 0.5 % IJ SOLN
INTRAMUSCULAR | Status: AC
  Filled 2019-10-25: qty 30

## 2019-10-25 MED ORDER — PROPOFOL 10 MG/ML IV BOLUS
INTRAVENOUS | Status: DC | PRN
  Administered 2019-10-25: 200 mg via INTRAVENOUS

## 2019-10-25 MED ORDER — LACTATED RINGERS IV SOLN: INTRAVENOUS | Status: DC | PRN

## 2019-10-25 MED ORDER — FENTANYL CITRATE (PF) 100 MCG/2ML IJ SOLN
INTRAMUSCULAR | Status: DC | PRN
  Administered 2019-10-25 (×3): 50 ug via INTRAVENOUS
  Administered 2019-10-25: 100 ug via INTRAVENOUS

## 2019-10-25 MED ORDER — MIDAZOLAM HCL 2 MG/2ML IJ SOLN: 0.5000 mg | Freq: Once | INTRAMUSCULAR | Status: DC | PRN

## 2019-10-25 MED ORDER — DEXAMETHASONE SODIUM PHOSPHATE 4 MG/ML IJ SOLN
INTRAMUSCULAR | Status: DC | PRN
  Administered 2019-10-25: 10 mg via INTRAVENOUS

## 2019-10-25 MED ORDER — ONDANSETRON 8 MG PO TBDP: 4.0000 mg | ORAL_TABLET | Freq: Three times a day (TID) | ORAL | 0 refills | Status: DC | PRN

## 2019-10-25 SURGICAL SUPPLY — 45 items
APPLIER CLIP ROT 10 11.4 M/L (STAPLE) ×3
BAG RETRIEVAL 10 (BASKET) ×1
BAG RETRIEVAL 10MM (BASKET) ×1
BLADE SURG 15 STRL LF DISP TIS (BLADE) ×1 IMPLANT
BLADE SURG 15 STRL SS (BLADE) ×2
CHLORAPREP W/TINT 26 (MISCELLANEOUS) ×3 IMPLANT
CLIP APPLIE ROT 10 11.4 M/L (STAPLE) ×1 IMPLANT
CLOTH BEACON ORANGE TIMEOUT ST (SAFETY) ×3 IMPLANT
COVER LIGHT HANDLE STERIS (MISCELLANEOUS) ×6 IMPLANT
COVER WAND RF STERILE (DRAPES) ×3 IMPLANT
DECANTER SPIKE VIAL GLASS SM (MISCELLANEOUS) ×3 IMPLANT
DERMABOND ADVANCED (GAUZE/BANDAGES/DRESSINGS) ×2
DERMABOND ADVANCED .7 DNX12 (GAUZE/BANDAGES/DRESSINGS) ×1 IMPLANT
ELECT REM PT RETURN 9FT ADLT (ELECTROSURGICAL) ×3
ELECTRODE REM PT RTRN 9FT ADLT (ELECTROSURGICAL) ×1 IMPLANT
GLOVE BIO SURGEON STRL SZ 6.5 (GLOVE) ×2 IMPLANT
GLOVE BIO SURGEONS STRL SZ 6.5 (GLOVE) ×1
GLOVE BIOGEL PI IND STRL 6.5 (GLOVE) ×1 IMPLANT
GLOVE BIOGEL PI IND STRL 7.0 (GLOVE) ×3 IMPLANT
GLOVE BIOGEL PI INDICATOR 6.5 (GLOVE) ×2
GLOVE BIOGEL PI INDICATOR 7.0 (GLOVE) ×8
GLOVE ECLIPSE 7.0 STRL STRAW (GLOVE) ×4 IMPLANT
GOWN STRL REUS W/TWL LRG LVL3 (GOWN DISPOSABLE) ×9 IMPLANT
HEMOSTAT SNOW SURGICEL 2X4 (HEMOSTASIS) ×3 IMPLANT
INST SET LAPROSCOPIC AP (KITS) ×3 IMPLANT
KIT TURNOVER KIT A (KITS) ×3 IMPLANT
MANIFOLD NEPTUNE II (INSTRUMENTS) ×3 IMPLANT
NDL INSUFFLATION 14GA 120MM (NEEDLE) ×1 IMPLANT
NEEDLE INSUFFLATION 14GA 120MM (NEEDLE) ×3 IMPLANT
NS IRRIG 1000ML POUR BTL (IV SOLUTION) ×3 IMPLANT
PACK LAP CHOLE LZT030E (CUSTOM PROCEDURE TRAY) ×3 IMPLANT
PAD ARMBOARD 7.5X6 YLW CONV (MISCELLANEOUS) ×3 IMPLANT
SET BASIN LINEN APH (SET/KITS/TRAYS/PACK) ×3 IMPLANT
SET TUBE SMOKE EVAC HIGH FLOW (TUBING) ×3 IMPLANT
SLEEVE ENDOPATH XCEL 5M (ENDOMECHANICALS) ×3 IMPLANT
SUT MNCRL AB 4-0 PS2 18 (SUTURE) ×5 IMPLANT
SUT VICRYL 0 UR6 27IN ABS (SUTURE) ×3 IMPLANT
SYS BAG RETRIEVAL 10MM (BASKET) ×1
SYSTEM BAG RETRIEVAL 10MM (BASKET) ×1 IMPLANT
TROCAR ENDO BLADELESS 11MM (ENDOMECHANICALS) ×3 IMPLANT
TROCAR XCEL NON-BLD 5MMX100MML (ENDOMECHANICALS) ×3 IMPLANT
TROCAR XCEL UNIV SLVE 11M 100M (ENDOMECHANICALS) ×3 IMPLANT
TUBE CONNECTING 12'X1/4 (SUCTIONS) ×1
TUBE CONNECTING 12X1/4 (SUCTIONS) ×2 IMPLANT
WARMER LAPAROSCOPE (MISCELLANEOUS) ×3 IMPLANT

## 2019-10-25 NOTE — Discharge Instructions (Signed)
Discharge Laparoscopic Surgery Instructions:  Common Complaints: Right shoulder pain is common after laparoscopic surgery. This is secondary to the gas used in the surgery being trapped under the diaphragm.  Walk to help your body absorb the gas. This will improve in a few days. Pain at the port sites are common, especially the larger port sites. This will improve with time.  Some nausea is common and poor appetite. The main goal is to stay hydrated the first few days after surgery.   Diet/ Activity: Diet as tolerated. You may not have an appetite, but it is important to stay hydrated. Drink 64 ounces of water a day. Your appetite will return with time.  Shower per your regular routine daily.  Do not take hot showers. Take warm showers that are less than 10 minutes. Rest and listen to your body, but do not remain in bed all day.  Walk everyday for at least 15-20 minutes. Deep cough and move around every 1-2 hours in the first few days after surgery.  Do not lift > 10 lbs, perform excessive bending, pushing, pulling, squatting for 1-2 weeks after surgery.  Do not pick at the dermabond glue on your incision sites.  This glue film will remain in place for 1-2 weeks and will start to peel off.  Do not place lotions or balms on your incision unless instructed to specifically by Dr. Constance Haw.   Medication: Take tylenol and ibuprofen as needed for pain control, alternating every 4-6 hours.  Example:  Tylenol 1000mg  @ 6am, 12noon, 6pm, 51midnight (Do not exceed 4000mg  of tylenol a day). Ibuprofen 800mg  @ 9am, 3pm, 9pm, 3am (Do not exceed 3600mg  of ibuprofen a day).  Take Roxicodone for breakthrough pain every 4 hours.  Take Colace for constipation related to narcotic pain medication. If you do not have a bowel movement in 2 days, take Miralax over the counter.  Drink plenty of water to also prevent constipation.   Contact Information: If you have questions or concerns, please call our office,  [phone], Monday- Thursday 8AM-5PM and Friday 8AM-12Noon.  If it is after hours or on the weekend, please call [facility] Main [facility], [phone], and ask to speak to the surgeon on call for Dr. Constance Haw at [facility].    Laparoscopic Cholecystectomy, Care After This sheet gives you information about how to care for yourself after your procedure. Your health care provider may also give you more specific instructions. If you have problems or questions, contact your health care provider. What can I expect after the procedure? After the procedure, it is common to have:  Pain at your incision sites. You will be given medicines to control this pain.  Mild nausea or vomiting.  Bloating and possible shoulder pain from the air-like gas that was used during the procedure. Follow these instructions at home: Incision care   Follow instructions from your health care provider about how to take care of your incisions. Make sure you: ? Wash your hands with soap and water before you change your bandage (dressing). If soap and water are not available, use hand sanitizer. ? Change your dressing as told by your health care provider. ? Leave stitches (sutures), skin glue, or adhesive strips in place. These skin closures may need to be in place for 2 weeks or longer. If adhesive strip edges start to loosen and curl up, you may trim the loose edges. Do not remove adhesive strips completely unless your health care provider tells you to do that.  Do not take baths, swim, or use a hot tub until your health care provider approves.   You may shower.  Check your incision area every day for signs of infection. Check for: ? More redness, swelling, or pain. ? More fluid or blood. ? Warmth. ? Pus or a bad smell. Activity  Do not drive or use heavy machinery while taking prescription pain medicine.  Do not lift anything that is heavier than 10 lb (4.5 kg) until your health care provider approves.  Do not play  contact sports until your health care provider approves.  Do not drive for 24 hours if you were given a medicine to help you relax (sedative).  Rest as needed. Do not return to work or school until your health care provider approves. General instructions  Take over-the-counter and prescription medicines only as told by your health care provider.  To prevent or treat constipation while you are taking prescription pain medicine, your health care provider may recommend that you: ? Drink enough fluid to keep your urine clear or pale yellow. ? Take over-the-counter or prescription medicines. ? Eat foods that are high in fiber, such as fresh fruits and vegetables, whole grains, and beans. ? Limit foods that are high in fat and processed sugars, such as fried and sweet foods. Contact a health care provider if:  You develop a rash.  You have more redness, swelling, or pain around your incisions.  You have more fluid or blood coming from your incisions.  Your incisions feel warm to the touch.  You have pus or a bad smell coming from your incisions.  You have a fever.  One or more of your incisions breaks open. Get help right away if:  You have trouble breathing.  You have chest pain.  You have increasing pain in your shoulders.  You faint or feel dizzy when you stand.  You have severe pain in your abdomen.  You have nausea or vomiting that lasts for more than one day.  You have leg pain. This information is not intended to replace advice given to you by your health care provider. Make sure you discuss any questions you have with your health care provider. Document Released: 10/26/2005 Document Revised: 10/08/2017 Document Reviewed: 04/13/2016 Elsevier Patient Education  2020 [place] Anesthesia, Adult, Care After This sheet gives you information about how to care for yourself after your procedure. Your health care provider may also give you more specific  instructions. If you have problems or questions, contact your health care provider. What can I expect after the procedure? After the procedure, the following side effects are common:  Pain or discomfort at the IV site.  Nausea.  Vomiting.  Sore throat.  Trouble concentrating.  Feeling cold or chills.  Weak or tired.  Sleepiness and fatigue.  Soreness and body aches. These side effects can affect parts of the body that were not involved in surgery. Follow these instructions at home:  For at least 24 hours after the procedure:  Have a responsible adult stay with you. It is important to have someone help care for you until you are awake and alert.  Rest as needed.  Do not: ? Participate in activities in which you could fall or become injured. ? Drive. ? Use heavy machinery. ? Drink alcohol. ? Take sleeping pills or medicines that cause drowsiness. ? Make important decisions or sign legal documents. ? Take care of children on your own. Eating  and drinking  Follow any instructions from your health care provider about eating or drinking restrictions.  When you feel hungry, start by eating small amounts of foods that are soft and easy to digest (bland), such as toast. Gradually return to your regular diet.  Drink enough fluid to keep your urine pale yellow.  If you vomit, rehydrate by drinking water, juice, or clear broth. General instructions  If you have sleep apnea, surgery and certain medicines can increase your risk for breathing problems. Follow instructions from your health care provider about wearing your sleep device: ? Anytime you are sleeping, including during daytime naps. ? While taking prescription pain medicines, sleeping medicines, or medicines that make you drowsy.  Return to your normal activities as told by your health care provider. Ask your health care provider what activities are safe for you.  Take over-the-counter and prescription medicines only  as told by your health care provider.  If you smoke, do not smoke without supervision.  Keep all follow-up visits as told by your health care provider. This is important. Contact a health care provider if:  You have nausea or vomiting that does not get better with medicine.  You cannot eat or drink without vomiting.  You have pain that does not get better with medicine.  You are unable to pass urine.  You develop a skin rash.  You have a fever.  You have redness around your IV site that gets worse. Get help right away if:  You have difficulty breathing.  You have chest pain.  You have blood in your urine or stool, or you vomit blood. Summary  After the procedure, it is common to have a sore throat or nausea. It is also common to feel tired.  Have a responsible adult stay with you for the first 24 hours after general anesthesia. It is important to have someone help care for you until you are awake and alert.  When you feel hungry, start by eating small amounts of foods that are soft and easy to digest (bland), such as toast. Gradually return to your regular diet.  Drink enough fluid to keep your urine pale yellow.  Return to your normal activities as told by your health care provider. Ask your health care provider what activities are safe for you. This information is not intended to replace advice given to you by your health care provider. Make sure you discuss any questions you have with your health care provider. Document Released: 02/01/2001 Document Revised: 10/29/2017 Document Reviewed: 06/11/2017 Elsevier Patient Education  2020 [employer].

## 2019-10-25 NOTE — Op Note (Signed)
Operative Note   Preoperative Diagnosis: Gallstone pancreatitis    Postoperative Diagnosis: Same   Procedure(s) Performed: Laparoscopic cholecystectomy   Surgeon: Ria Comment C. Constance Haw, MD   Assistants: No qualified resident was available   Anesthesia: General endotracheal   Anesthesiologist:  Dr. Hilaria Ota, MD    Specimens: Gallbladder    Estimated Blood Loss: Minimal    Blood Replacement: None    Complications: None    Operative Findings:  Long thin gallbladder with gradually tapering enlarged cystic duct    Procedure: The patient was taken to the operating room and placed supine. General endotracheal anesthesia was induced. Intravenous antibiotics were administered per protocol. An orogastric tube positioned to decompress the stomach. The abdomen was prepared and draped in the usual sterile fashion.    A supraumbilical incision was made and a Veress technique was utilized to achieve pneumoperitoneum to 15 mmHg with carbon dioxide. A 11 mm optiview port was placed through the supraumbilical region, and a 10 mm 0-degree operative laparoscope was introduced. The area underlying the trocar and Veress needle were inspected and without evidence of injury.  Remaining trocars were placed under direct vision. Two 5 mm ports were placed in the right abdomen, between the anterior axillary and midclavicular line.  A final 11 mm port was placed through the mid-epigastrium, near the falciform ligament.    The gallbladder fundus was elevated cephalad and the infundibulum was retracted to the patient's right. The gallbladder/cystic duct junction was skeletonized. The cystic artery noted in the triangle of Calot and was also skeletonized.  We then continued liberal medial and lateral dissection until the critical view of safety was achieved.    The cystic duct and cystic artery were doubly clipped and divided. The gallbladder was then dissected from the liver bed with electrocautery. The specimen was  placed in an Endopouch and was retrieved through the epigastric site.   Final inspection revealed acceptable hemostasis. Surgical Emogene Morgan was placed in the gallbladder bed. The epigastric and umbilical port sites were smaller than my finger. Trocars were removed and pneumoperitoneum was released. Skin incisions were closed with 4-0 Monocryl subcuticular sutures and Dermabond. The patient was awakened from anesthesia and extubated without complication.    Curlene Labrum, MD [facility] [address] [place], [place] [address] [phone] (office)

## 2019-10-25 NOTE — Anesthesia Postprocedure Evaluation (Signed)
Anesthesia Post Note Late entry for 1020 Patient: Donna Vega  Procedure(s) Performed: LAPAROSCOPIC CHOLECYSTECTOMY (N/A )  Patient location during evaluation: PACU Anesthesia Type: General Level of consciousness: awake and alert, oriented and patient cooperative Pain management: pain level controlled Vital Signs Assessment: post-procedure vital signs reviewed and stable Respiratory status: spontaneous breathing Cardiovascular status: stable Postop Assessment: no apparent nausea or vomiting Anesthetic complications: no     Last Vitals:  Vitals:   10/25/19 1045 10/25/19 1100  BP: (!) 155/84 (!) 141/83  Pulse: 70 73  Resp: (!) 8 11  Temp:    SpO2: 100% 96%    Last Pain:  Vitals:   10/25/19 1100  TempSrc:   PainSc: 4                  Holiday Mcmenamin A

## 2019-10-25 NOTE — Progress Notes (Signed)
[facility]  Spoke with husband, [name] regarding surgery. Rx sent to pharmacy. Plan for going back to work 12/28. FMLA will be taken to the office.  Curlene Labrum, MD [facility] [address] [place], [place] [address] [phone] (office)

## 2019-10-25 NOTE — Interval H&P Note (Signed)
History and Physical Interval Note:  10/25/2019 8:51 AM  Donna Vega  has presented today for surgery, with the diagnosis of Gallstone pancreatitis.  The various methods of treatment have been discussed with the patient and family. After consideration of risks, benefits and other options for treatment, the patient has consented to  Procedure(s): LAPAROSCOPIC CHOLECYSTECTOMY (N/A) as a surgical intervention.  The patient's history has been reviewed, patient examined, no change in status, stable for surgery.  I have reviewed the patient's chart and labs.  Questions were answered to the patient's satisfaction.    Doing well. Pain improved. Still some pain with deep breaths. Eating.   Virl Cagey

## 2019-10-25 NOTE — Transfer of Care (Signed)
Immediate Anesthesia Transfer of Care Note  Patient: Donna Vega  Procedure(s) Performed: LAPAROSCOPIC CHOLECYSTECTOMY (N/A )  Patient Location: PACU  Anesthesia Type:General  Level of Consciousness: oriented, drowsy and patient cooperative  Airway & Oxygen Therapy: Patient Spontanous Breathing and Patient connected to face mask oxygen  Post-op Assessment: Report given to RN and Post -op Vital signs reviewed and stable  Post vital signs: Reviewed and stable  Last Vitals:  Vitals Value Taken Time  BP    Temp    Pulse    Resp    SpO2      Last Pain:  Vitals:   10/25/19 0820  TempSrc: Oral  PainSc: 0-No pain      Patients Stated Pain Goal: 9 ([id number])  Complications: No apparent anesthesia complications

## 2019-10-25 NOTE — Anesthesia Preprocedure Evaluation (Signed)
Anesthesia Evaluation  Patient identified by MRN, date of birth, ID band Patient awake    Reviewed: Allergy & Precautions, NPO status , Patient's Chart, lab work & pertinent test results  Airway Mallampati: II  TM Distance: >3 FB Neck ROM: Full    Dental no notable dental hx. (+) Teeth Intact   Pulmonary neg pulmonary ROS,    Pulmonary exam normal breath sounds clear to auscultation       Cardiovascular Exercise Tolerance: Good negative cardio ROS Normal cardiovascular examI Rhythm:Regular Rate:Normal     Neuro/Psych Depression negative neurological ROS  negative psych ROS   GI/Hepatic Neg liver ROS, GERD  Medicated and Controlled,  Endo/Other  negative endocrine ROS  Renal/GU negative Renal ROS  negative genitourinary   Musculoskeletal negative musculoskeletal ROS (+)   Abdominal   Peds negative pediatric ROS (+)  Hematology negative hematology ROS (+)   Anesthesia Other Findings H/o LLE melanoma -s/p W.E 2018   Reproductive/Obstetrics negative OB ROS                             Anesthesia Physical Anesthesia Plan  ASA: II  Anesthesia Plan: General   Post-op Pain Management:    Induction: Intravenous  PONV Risk Score and Plan: 3 and Ondansetron, Dexamethasone, Midazolam and Treatment may vary due to age or medical condition  Airway Management Planned: Oral ETT  Additional Equipment:   Intra-op Plan:   Post-operative Plan: Extubation in OR  Informed Consent: I have reviewed the patients History and Physical, chart, labs and discussed the procedure including the risks, benefits and alternatives for the proposed anesthesia with the patient or authorized representative who has indicated his/her understanding and acceptance.     Dental advisory given  Plan Discussed with: CRNA  Anesthesia Plan Comments: (Plan Full PPE use Plan GETA D/W PT -WTP with same after Q&A)         Anesthesia Quick Evaluation

## 2019-10-25 NOTE — Anesthesia Procedure Notes (Signed)
Procedure Name: Intubation Date/Time: 10/25/2019 9:10 AM Performed by: Andree Elk, Kaylen Motl A, CRNA Pre-anesthesia Checklist: Patient identified, Patient being monitored, Timeout performed, Emergency Drugs available and Suction available Patient Re-evaluated:Patient Re-evaluated prior to induction Oxygen Delivery Method: Circle system utilized Preoxygenation: Pre-oxygenation with 100% oxygen Induction Type: IV induction Ventilation: Mask ventilation without difficulty Laryngoscope Size: 3 and Glidescope Grade View: Grade I Tube type: Oral Tube size: 7.0 mm Number of attempts: 1 Airway Equipment and Method: Stylet Placement Confirmation: ETT inserted through vocal cords under direct vision,  positive ETCO2 and breath sounds checked- equal and bilateral Secured at: 21 cm Tube secured with: Tape Dental Injury: Teeth and Oropharynx as per pre-operative assessment

## 2019-10-31 LAB — SURGICAL PATHOLOGY

## 2019-11-07 ENCOUNTER — Telehealth (INDEPENDENT_AMBULATORY_CARE_PROVIDER_SITE_OTHER): Payer: Self-pay | Admitting: General Surgery

## 2019-11-07 ENCOUNTER — Telehealth: Payer: Self-pay | Admitting: General Surgery

## 2019-11-07 ENCOUNTER — Other Ambulatory Visit: Payer: Self-pay

## 2019-11-07 DIAGNOSIS — K851 Biliary acute pancreatitis without necrosis or infection: Secondary | ICD-10-CM

## 2019-11-07 NOTE — Progress Notes (Signed)
[facility]  I am calling the patient for post operative evaluation due to the current COVID 19 pandemic.  The patient had a laparoscopic cholecystectomy on 10/25/19. The patient reports that they are doing well. The are tolerating a diet, having good pain control, and having regular Bms.  The patient has no concerns.  Dermabond has peeled and healing. Activity and diet as tolerated.  Activity and diet as tolerated. GI has been doing her FMLA.   Will see the patient PRN.   Pathology: FINAL MICROSCOPIC DIAGNOSIS:   A. GALLBLADDER, CHOLECYSTECTOMY:  - Chronic cholecystitis with cholelithiasis  - Cholesterolosis  - One benign lymph node   Curlene Labrum, MD [facility] [place], [place] [address] [phone] (office)

## 2020-08-17 IMAGING — CT CT ABD-PELV W/ CM
3 of 5 series · 10 of 46 positions shown, 17 images · IV contrast (Omnipaque or Isovue)
Comparison: CT Abdomen and Pelvis 09/28/2019 and earlier.

CLINICAL DATA: 53-year-old female with recent gallstone
pancreatitis, ERCP.

EXAM:
CT ABDOMEN AND PELVIS WITH CONTRAST
TECHNIQUE: Multidetector CT imaging of the abdomen and pelvis was performed
using the standard protocol following bolus administration of
intravenous contrast.
CONTRAST:  100mL OMNIPAQUE IOHEXOL 300 MG/ML  SOLN

[Series 2: axial st · axial · 0.98mm/px · z∈[+1057,+1427]mm · 6 of 104 slices shown, 11 images]
[im 15/104  soft-tissue]
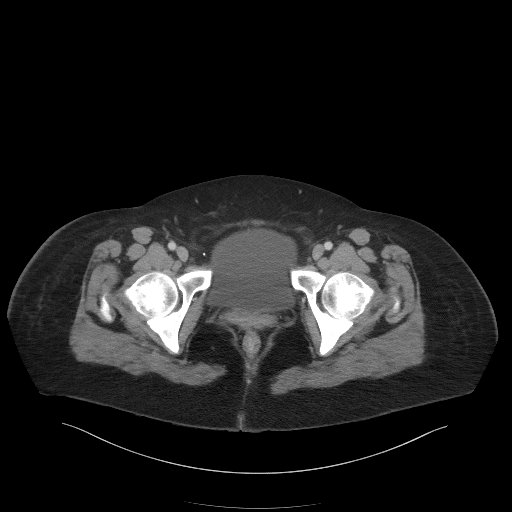
[im 15/104  bone]
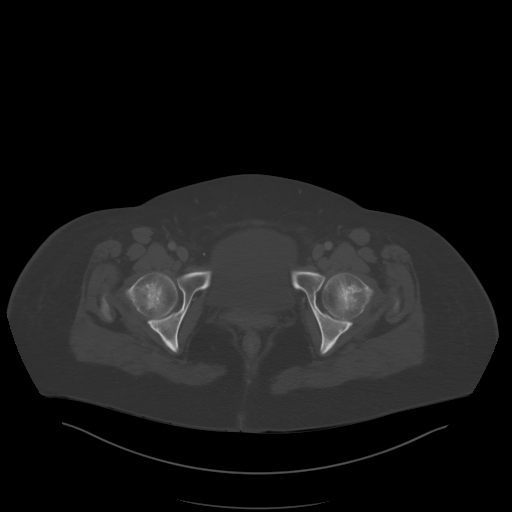
[im 30/104  soft-tissue]
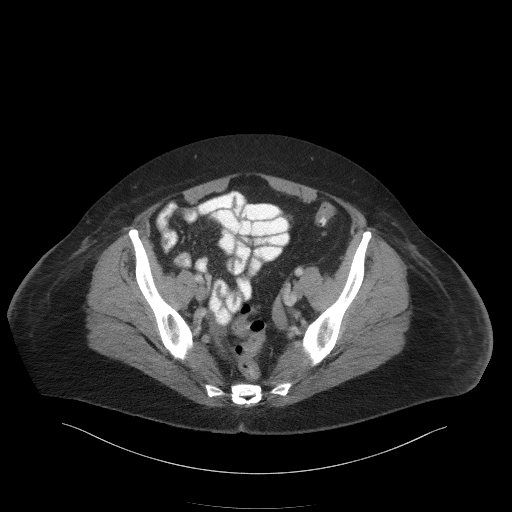
[im 45/104  soft-tissue]
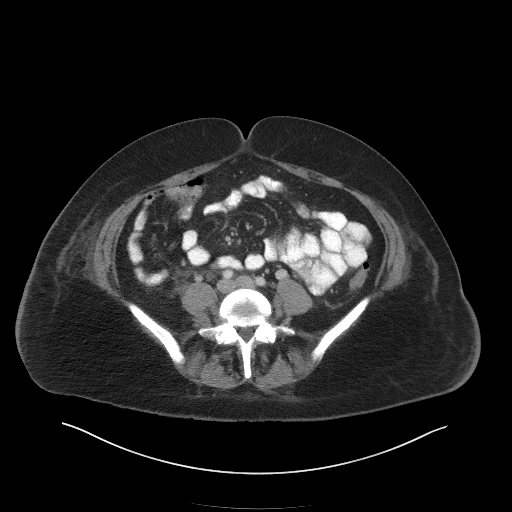
[im 45/104  lung]
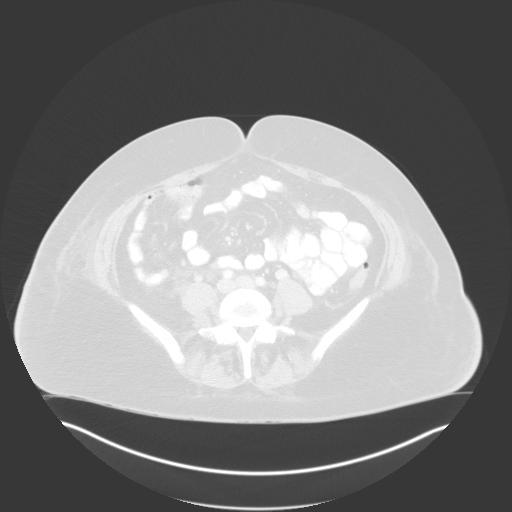
[im 59/104  soft-tissue]
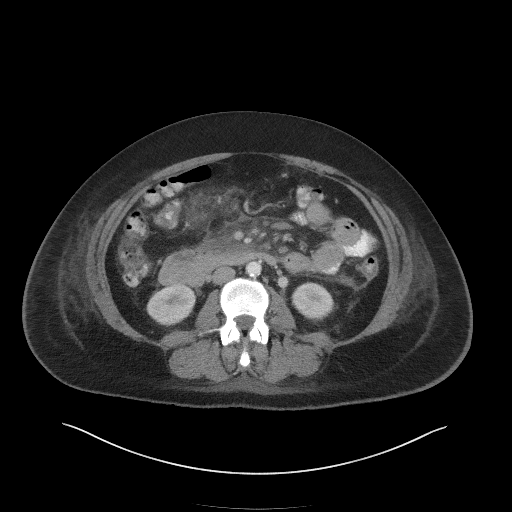
[im 59/104  lung]
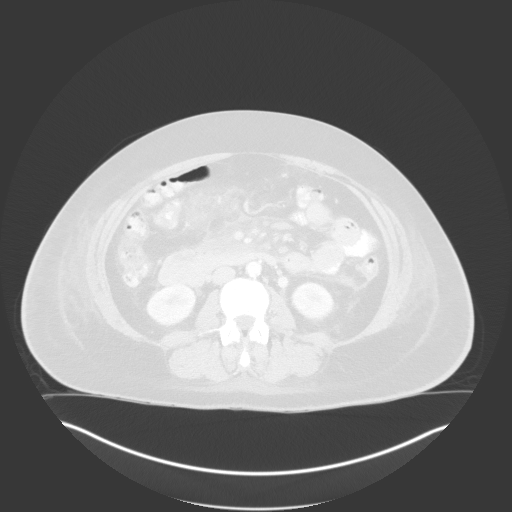
[im 74/104  soft-tissue]
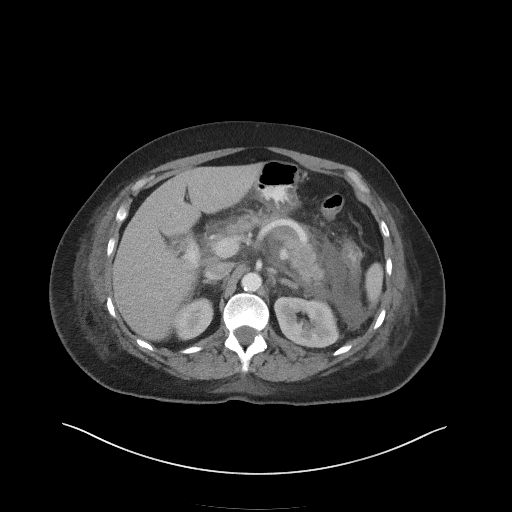
[im 74/104  lung]
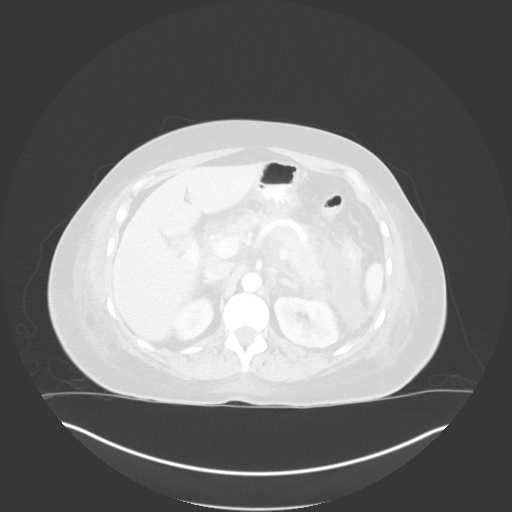
[im 89/104  soft-tissue]
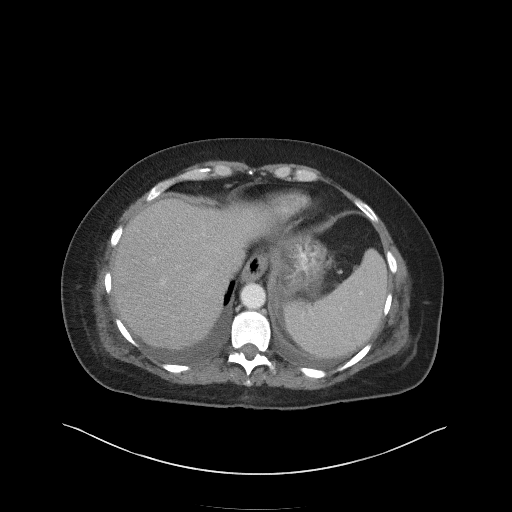
[im 89/104  lung]
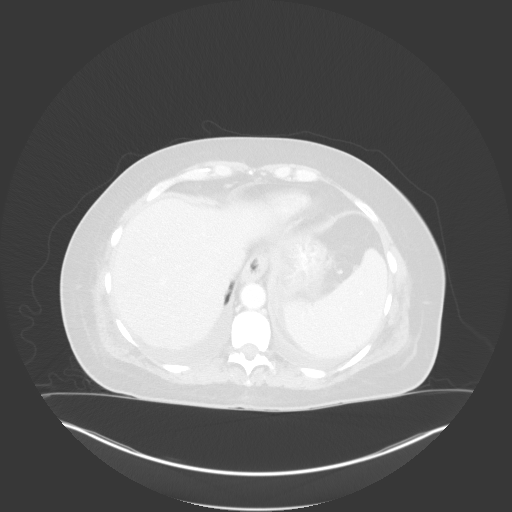

[Series 5: coronal st · coronal · 0.91mm/px · 3 of 98 slices shown, 4 images]
[im 33/98  soft-tissue]
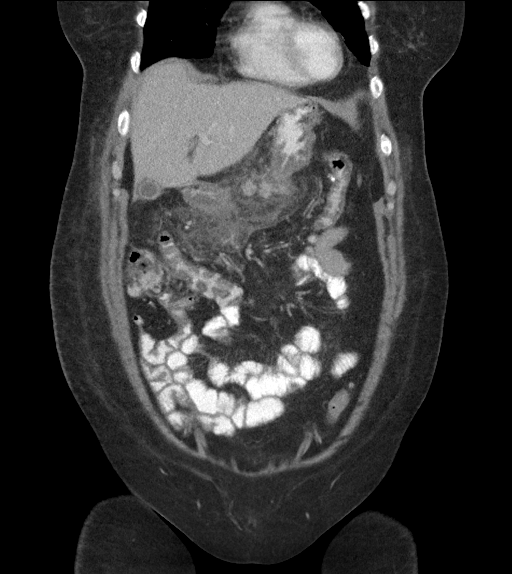
[im 44/98  soft-tissue]
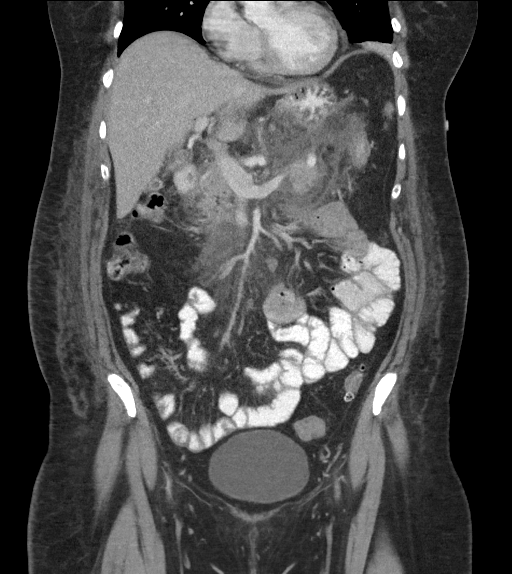
[im 44/98  bone]
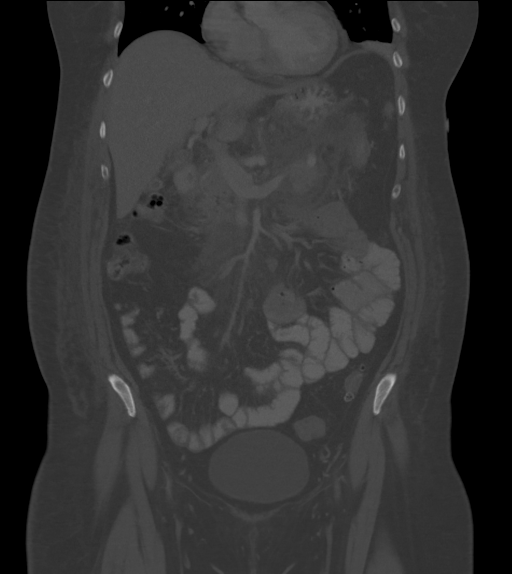
[im 54/98  soft-tissue]
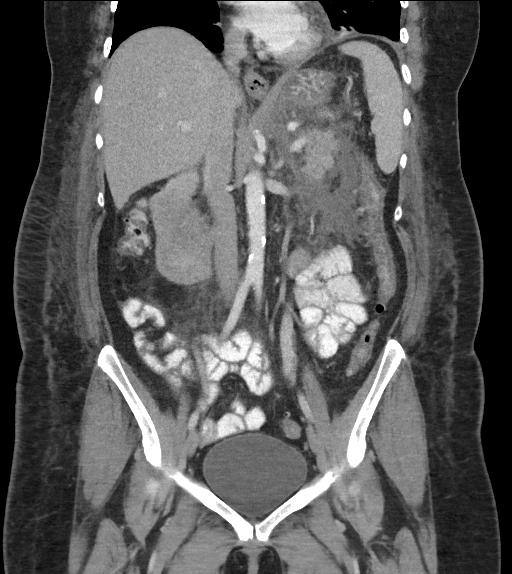

[Series 6: sagittal st · sagittal · 0.70mm/px · 1 of 128 slices shown, 2 images]
[im 43/128  soft-tissue]
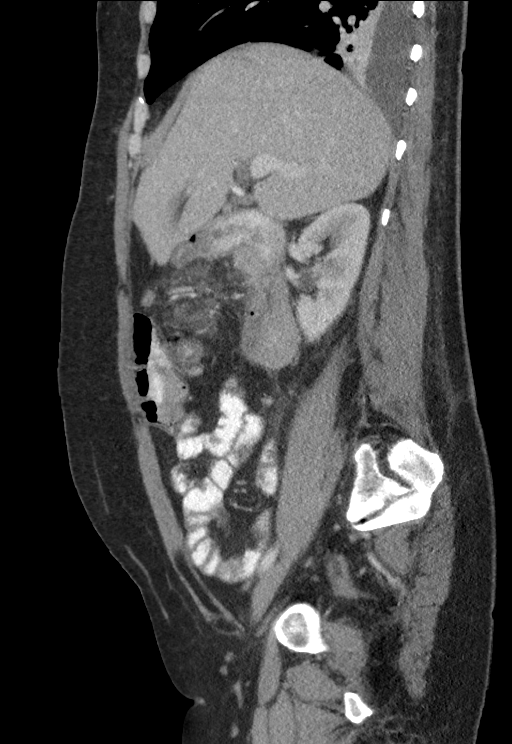
[im 43/128  bone]
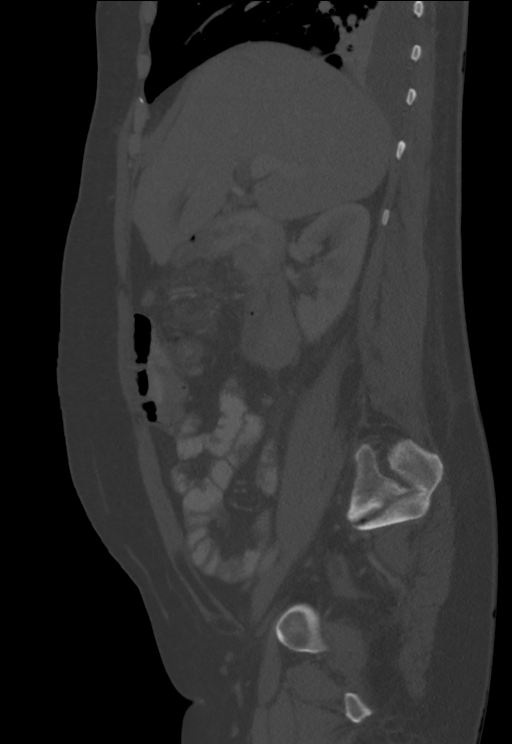

[10 of 46 positions shown; findings below may reference images not displayed]

FINDINGS: Lower chest: Moderate new left and small layering right pleural
effusions with confluent associated lower lobe atelectasis. Middle
lobes remain clear. No pericardial effusion.

Hepatobiliary: Resolved intra-and extrahepatic biliary ductal
dilatation since 09/28/2019. Liver enhancement within normal limits
(stable small right hepatic lobe subcentimeter low-density area on
series 2, image 30, probable benign cyst). The gallbladder now is
contracted with mild wall thickening.

Pancreas: Confluent inflammation throughout the lesser sac with
stranding and fluid. Pancreatic enhancement is maintained, no
pancreatic necrosis identified. No pancreatic ductal dilatation. No
organized or rim enhancing fluid collection.

Spleen: Negative aside from adjacent inflammation and free fluid.

Adrenals/Urinary Tract: Normal adrenal glands. Bilateral renal
enhancement and contrast excretion appears symmetric and within
normal limits. Unremarkable urinary bladder. Stable pelvic
phleboliths.

Stomach/Bowel: Decompressed descending and rectosigmoid colon.
Diverticulosis from the proximal sigmoid throughout the left colon
to the splenic flexure. Oral contrast has reached the descending
colon. Negative transverse colon. Right colon is on a lax mesentery.
Normal appendix on series 2, image 61. No large bowel inflammation.
Negative terminal ileum. No dilated small bowel.

Inflammation throughout the lesser sac with mild mass effect on the
stomach and duodenum. No free air.

Vascular/Lymphatic: Calcified aortic atherosclerosis. Major arterial
structures remain patent. Portal venous system including the splenic
vein remain patent.

No lymphadenopathy.

Reproductive: Negative.

Other: New small to moderate volume of pelvic free fluid with simple
fluid density.

Musculoskeletal: No acute osseous abnormality identified.
IMPRESSION: 1. Acute pancreatitis with extensive inflammation and fluid tracking
from the lesser sac. No pancreatic necrosis. No organized or rim
enhancing fluid collection.
2. Resolved biliary and gallbladder dilatation.
3. New moderate left and small right layering pleural effusions with
lung base atelectasis. New pelvic free fluid.
4. Large bowel diverticulosis. Aortic Atherosclerosis ([id number]).

## 2021-04-29 ENCOUNTER — Telehealth: Payer: Self-pay

## 2021-04-29 NOTE — Telephone Encounter (Signed)
Ms Sammon called [facility] office today to reschedule her enrollment appointment for today. She was rescheduled for 05/06/21 at 10 am at [place] office. Reviewed documents needed for enrollment.  [place] [employer]

## 2021-06-24 ENCOUNTER — Other Ambulatory Visit (HOSPITAL_COMMUNITY): Payer: Self-pay | Admitting: Family Medicine

## 2021-06-24 DIAGNOSIS — Z1231 Encounter for screening mammogram for malignant neoplasm of breast: Secondary | ICD-10-CM

## 2021-07-09 ENCOUNTER — Encounter (HOSPITAL_COMMUNITY): Payer: Self-pay

## 2021-07-09 ENCOUNTER — Ambulatory Visit (HOSPITAL_COMMUNITY): Payer: 59

## 2021-07-28 ENCOUNTER — Ambulatory Visit (HOSPITAL_COMMUNITY)
Admission: RE | Admit: 2021-07-28 | Discharge: 2021-07-28 | Disposition: A | Discharge status: Home / Self Care | Payer: Self-pay | Source: Ambulatory Visit | Attending: Family Medicine | Admitting: Family Medicine

## 2021-07-28 ENCOUNTER — Other Ambulatory Visit: Payer: Self-pay

## 2021-07-28 DIAGNOSIS — Z1231 Encounter for screening mammogram for malignant neoplasm of breast: Secondary | ICD-10-CM | POA: Insufficient documentation

## 2021-10-22 DIAGNOSIS — Z23 Encounter for immunization: Secondary | ICD-10-CM | POA: Diagnosis not present

## 2021-11-04 ENCOUNTER — Encounter: Payer: Self-pay | Admitting: *Deleted

## 2021-12-24 ENCOUNTER — Ambulatory Visit (INDEPENDENT_AMBULATORY_CARE_PROVIDER_SITE_OTHER): Payer: Self-pay | Admitting: *Deleted

## 2021-12-24 ENCOUNTER — Other Ambulatory Visit: Payer: Self-pay

## 2021-12-24 VITALS — Ht 68.0 in | Wt 203.0 lb

## 2021-12-24 DIAGNOSIS — Z1211 Encounter for screening for malignant neoplasm of colon: Secondary | ICD-10-CM

## 2021-12-24 NOTE — Progress Notes (Signed)
Ok to schedule. ASA 2.  Day of prep and AM of TCS: hold metformin

## 2021-12-24 NOTE — Progress Notes (Addendum)
Gastroenterology Pre-Procedure Review  Request Date: 12/24/2021 Requesting Physician: Rubin Payor, NP, no previous TCS, family hx of colon cancer (mother)  PATIENT REVIEW QUESTIONS: The patient responded to the following health history questions as indicated:    1. Diabetes Melitis: yes, pre-diabetic 2. Joint replacements in the past 12 months: no 3. Major health problems in the past 3 months: no 4. Has an artificial valve or MVP: no 5. Has a defibrillator: no 6. Has been advised in past to take antibiotics in advance of a procedure like teeth cleaning: no 7. Family history of colon cancer: yes, mother: age early 3's  8. Alcohol Use: no 9. Illicit drug Use: no 10. History of sleep apnea: no  11. History of coronary artery or other vascular stents placed within the last 12 months: no 12. History of any prior anesthesia complications: no 13. Body mass index is 30.87 kg/m.    MEDICATIONS & ALLERGIES:    Patient reports the following regarding taking any blood thinners:   Plavix? no Aspirin? no Coumadin? no Brilinta? no Xarelto? no Eliquis? no Pradaxa? no Savaysa? no Effient? no  Patient confirms/reports the following medications:  Current Outpatient Medications  Medication Sig Dispense Refill   acetaminophen (TYLENOL) 325 MG tablet Take 2 tablets (650 mg total) by mouth every 6 (six) hours as needed for mild pain or headache (or Fever >/= 101). (Patient taking differently: Take 650 mg by mouth as needed for mild pain or headache (or Fever >/= 101).) 40 tablet 0   lisinopril-hydrochlorothiazide (ZESTORETIC) 20-12.5 MG tablet daily at 6 (six) AM.     metFORMIN (GLUCOPHAGE) 500 MG tablet Take 500 mg by mouth daily.     PARoxetine (PAXIL) 20 MG tablet Take 40 mg by mouth daily. Takes 40 mg daily.     No current facility-administered medications for this visit.    Patient confirms/reports the following allergies:  No Known Allergies  No orders of the defined types were placed  in this encounter.   AUTHORIZATION INFORMATION Primary Insurance: [place],  [place] #: [id number],  Group #: [id number] Pre-Cert / Josem Kaufmann required: No, not required per Samara Snide / Auth #: REF#: [id number]  SCHEDULE INFORMATION: Procedure has been scheduled as follows:  Date: 01/23/2022, Time: 9:00 Location: [facility] with Dr. Abbey Chatters  This Gastroenterology Pre-Precedure Review Form is being routed to the following provider(s): Neil Crouch, PA-C

## 2021-12-25 NOTE — Progress Notes (Signed)
Lmom for pt to call me back. 

## 2021-12-25 NOTE — Progress Notes (Signed)
Spoke to pt and informed her that I will call her once Dr. Roseanne Kaufman procedure schedules are available.

## 2022-01-13 ENCOUNTER — Encounter: Payer: Self-pay | Admitting: *Deleted

## 2022-01-13 ENCOUNTER — Other Ambulatory Visit: Payer: Self-pay | Admitting: *Deleted

## 2022-01-13 DIAGNOSIS — Z1211 Encounter for screening for malignant neoplasm of colon: Secondary | ICD-10-CM

## 2022-01-13 MED ORDER — CLENPIQ 10-3.5-12 MG-GM -GM/160ML PO SOLN: 1.0000 | Freq: Once | ORAL | 0 refills | Status: AC

## 2022-01-13 NOTE — Addendum Note (Signed)
Addended by: Metro Kung on: 01/13/2022 03:56 PM   Modules accepted: Orders

## 2022-01-13 NOTE — Progress Notes (Signed)
Spoke to pt.  Scheduled procedure for 01/23/2022 at 9:00, arrival at 7:30 at [facility].  Reviewed prep instructions with pt by phone.  Pt advised of how to adjust her medications.  Pt aware that I am mailing out prep instructions and have sent RX for prep kit to pharmacy.  Confirmed mailing address.

## 2022-01-14 ENCOUNTER — Other Ambulatory Visit: Payer: Self-pay | Admitting: *Deleted

## 2022-01-21 ENCOUNTER — Other Ambulatory Visit (HOSPITAL_COMMUNITY)
Admission: RE | Admit: 2022-01-21 | Discharge: 2022-01-21 | Disposition: A | Discharge status: Home / Self Care | Payer: 59 | Source: Ambulatory Visit | Attending: Internal Medicine | Admitting: Internal Medicine

## 2022-01-21 DIAGNOSIS — Z1211 Encounter for screening for malignant neoplasm of colon: Secondary | ICD-10-CM | POA: Diagnosis not present

## 2022-01-21 LAB — BASIC METABOLIC PANEL
Anion gap: 8 (ref 5–15)
BUN: 19 mg/dL (ref 6–20)
CO2: 28 mmol/L (ref 22–32)
Calcium: 9.9 mg/dL (ref 8.9–10.3)
Chloride: 100 mmol/L (ref 98–111)
Creatinine, Ser: 0.87 mg/dL (ref 0.44–1.00)
GFR, Estimated: >60 mL/min (ref >60)
Glucose, Bld: 91 mg/dL (ref 70–99)
Potassium: 4.5 mmol/L (ref 3.5–5.1)
Sodium: 136 mmol/L (ref 135–145)

## 2022-01-23 ENCOUNTER — Other Ambulatory Visit: Payer: Self-pay

## 2022-01-23 ENCOUNTER — Encounter (HOSPITAL_COMMUNITY)
Admission: RE | Disposition: A | Discharge status: Home / Self Care | Payer: Self-pay | Source: Home / Self Care | Attending: Internal Medicine

## 2022-01-23 ENCOUNTER — Ambulatory Visit (HOSPITAL_BASED_OUTPATIENT_CLINIC_OR_DEPARTMENT_OTHER): Payer: 59 | Admitting: Anesthesiology

## 2022-01-23 ENCOUNTER — Ambulatory Visit (HOSPITAL_COMMUNITY)
Admission: RE | Admit: 2022-01-23 | Discharge: 2022-01-23 | Disposition: A | Discharge status: Home / Self Care | Payer: 59 | Attending: Internal Medicine | Admitting: Internal Medicine

## 2022-01-23 ENCOUNTER — Ambulatory Visit (HOSPITAL_COMMUNITY): Payer: 59 | Admitting: Anesthesiology

## 2022-01-23 ENCOUNTER — Encounter (HOSPITAL_COMMUNITY): Payer: Self-pay | Admitting: Internal Medicine

## 2022-01-23 DIAGNOSIS — K5730 Diverticulosis of large intestine without perforation or abscess without bleeding: Secondary | ICD-10-CM

## 2022-01-23 DIAGNOSIS — Z1211 Encounter for screening for malignant neoplasm of colon: Secondary | ICD-10-CM

## 2022-01-23 DIAGNOSIS — K219 Gastro-esophageal reflux disease without esophagitis: Secondary | ICD-10-CM | POA: Insufficient documentation

## 2022-01-23 DIAGNOSIS — K648 Other hemorrhoids: Secondary | ICD-10-CM

## 2022-01-23 DIAGNOSIS — F32A Depression, unspecified: Secondary | ICD-10-CM | POA: Diagnosis not present

## 2022-01-23 DIAGNOSIS — Z800 Family history of malignant neoplasm of digestive organs: Secondary | ICD-10-CM | POA: Diagnosis not present

## 2022-01-23 DIAGNOSIS — K640 First degree hemorrhoids: Secondary | ICD-10-CM | POA: Insufficient documentation

## 2022-01-23 HISTORY — PX: COLONOSCOPY WITH PROPOFOL: SHX5780

## 2022-01-23 SURGERY — COLONOSCOPY WITH PROPOFOL
Anesthesia: General

## 2022-01-23 MED ORDER — PROPOFOL 500 MG/50ML IV EMUL
INTRAVENOUS | Status: DC | PRN
  Administered 2022-01-23: 150 ug/kg/min via INTRAVENOUS

## 2022-01-23 MED ORDER — LIDOCAINE HCL (CARDIAC) PF 100 MG/5ML IV SOSY
PREFILLED_SYRINGE | INTRAVENOUS | Status: DC | PRN
  Administered 2022-01-23: 50 mg via INTRAVENOUS

## 2022-01-23 MED ORDER — LACTATED RINGERS IV SOLN: INTRAVENOUS | Status: DC

## 2022-01-23 MED ORDER — PROPOFOL 10 MG/ML IV BOLUS
INTRAVENOUS | Status: DC | PRN
  Administered 2022-01-23: 100 mg via INTRAVENOUS

## 2022-01-23 MED ORDER — SIMETHICONE 40 MG/0.6ML PO SUSP
ORAL | Status: DC | PRN
  Administered 2022-01-23: 60 mL

## 2022-01-23 NOTE — Anesthesia Postprocedure Evaluation (Signed)
Anesthesia Post Note ? ?Patient: Donna Vega ? ?Procedure(s) Performed: COLONOSCOPY WITH PROPOFOL ? ?Patient location during evaluation: Phase II ?Anesthesia Type: General ?Level of consciousness: awake ?Pain management: pain level controlled ?Vital Signs Assessment: post-procedure vital signs reviewed and stable ?Respiratory status: spontaneous breathing and respiratory function stable ?Cardiovascular status: blood pressure returned to baseline and stable ?Postop Assessment: no headache and no apparent nausea or vomiting ?Anesthetic complications: no ?Comments: Late entry ? ? ?No notable events documented. ? ? ?Last Vitals:  ?Vitals:  ? 01/23/22 0849 01/23/22 0853  ?BP: (!) 95/53 (!) 99/58  ?Pulse: 65 74  ?Resp: 16 18  ?Temp: 36.4 ?C   ?SpO2: 96% 97%  ?  ?Last Pain:  ?Vitals:  ? 01/23/22 0853  ?TempSrc:   ?PainSc: 0-No pain  ? ? ?  ?  ?  ?  ?  ?  ? ?Louann Sjogren ? ? ? ? ?

## 2022-01-23 NOTE — H&P (Signed)
?$'@LOGO'E$ @ ? ? ?Primary Care Physician:  Tylene Fantasia, NP ?Primary Gastroenterologist:  Dr. Gala Romney ? ?Pre-Procedure History & Physical: ?HPI:  Donna Vega is a 56 y.o. female is here for a screening colonoscopy.  No bowel symptoms.  No prior colonoscopy.  Mother diagnosed with colon cancer in her early 7s. ? ?Past Medical History:  ?Diagnosis Date  ? Depression   ? Eating disorder   ? GERD (gastroesophageal reflux disease)   ? Hypercholesteremia   ? 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 ? ?Past Surgical History:  ?Procedure Laterality Date  ? CHOLECYSTECTOMY N/A 10/25/2019  ? Procedure: LAPAROSCOPIC CHOLECYSTECTOMY;  Surgeon: Virl Cagey, MD;  Location: [facility];  Service: General;  Laterality: N/A;  ? ERCP N/A 09/29/2019  ? Procedure: ENDOSCOPIC RETROGRADE CHOLANGIOPANCREATOGRAPHY (ERCP) WITH SPHINCTEROTOMY AND STONE EXTRACTION;  Surgeon: Rogene Houston, MD;  Location: [facility];  Service: Endoscopy;  Laterality: N/A;  ? TUBAL LIGATION  1997  ? ? ?Prior to Admission medications   ?Medication Sig Start Date End Date Taking? Authorizing Provider  ?acetaminophen (TYLENOL) 325 MG tablet Take 2 tablets (650 mg total) by mouth every 6 (six) hours as needed for mild pain or headache (or Fever >/= 101). 10/04/19  Yes Barton Dubois, MD  ?lisinopril-hydrochlorothiazide (ZESTORETIC) 20-12.5 MG tablet Take 1 tablet by mouth daily at 6 (six) AM.   Yes [provider]  ?metFORMIN (GLUCOPHAGE) 500 MG tablet Take 500 mg by mouth daily. 10/23/21  Yes [provider]  ?Multiple Vitamins-Minerals (MULTIVITAMIN ADULT) CHEW Chew 1 tablet by mouth daily at 6 (six) AM.   Yes [provider]  ?PARoxetine (PAXIL) 20 MG tablet Take 40 mg by mouth in the morning. 09/04/19 01/23/22 Yes [provider]  ? ? ?Allergies as of 01/13/2022  ? (No Known Allergies)  ? ? ?Family History  ?Problem Relation Age of Onset  ? Colon cancer Mother 15  ?     colon   ? Hypertension Father   ? Hyperlipidemia Father    ? Liver disease Neg Hx   ? ? ?Social History  ? ?Socioeconomic History  ? Marital status: Married  ?  Spouse name: Not on file  ? Number of children: Not on file  ? Years of education: Not on file  ? Highest education level: Not on file  ?Occupational History  ? Not on file  ?Tobacco Use  ? Smoking status: Never  ? Smokeless tobacco: Never  ?Vaping Use  ? Vaping Use: Never used  ?Substance and Sexual Activity  ? Alcohol use: Not Currently  ?  Comment: stopped drinking 2 months ago as of 10/23/2019  ? Drug use: No  ? Sexual activity: Yes  ?  Partners: Male  ?Other Topics Concern  ? Not on file  ?Social History Narrative  ? Not on file  ? ?Social Determinants of Health  ? ?Financial Resource Strain: Not on file  ?Food Insecurity: Not on file  ?Transportation Needs: Not on file  ?Physical Activity: Not on file  ?Stress: Not on file  ?Social Connections: Not on file  ?Intimate Partner Violence: Not on file  ? ? ?Review of Systems: ?See HPI, otherwise negative ROS ? ?Physical Exam: ?BP (!) 145/82   Pulse 73   Temp 98.2 ?F (36.8 ?C) (Oral)   Resp 18   SpO2 97%  ?General:   Alert,  Well-developed, well-nourished, pleasant and cooperative in NAD ?Head:  Normocephalic and atraumatic. ?Eyes:  Sclera clear, no icterus.  Conjunctiva pink. ?Lungs:  Clear throughout to auscultation.   No wheezes, crackles, or rhonchi. No acute distress. ?Heart:  Regular rate and rhythm; no murmurs, clicks, rubs,  or gallops. ?Abdomen:  Soft, nontender and nondistended. No masses, hepatosplenomegaly or hernias noted. Normal bowel sounds, without guarding, and without rebound.   ? ? ?Impression/Plan: ?[name] is now here to undergo a screening colonoscopy.  High risk screening examination. ? ?Risks, benefits, limitations, imponderables and alternatives regarding colonoscopy have been reviewed with the patient. Questions have been answered. All parties agreeable.  ? ? ? ?Notice:  This dictation was prepared with Dragon dictation along  with smaller phrase technology. Any transcriptional errors that result from this process are unintentional and may not be corrected upon review.  ?

## 2022-01-23 NOTE — Op Note (Signed)
[facility] ?Patient Name: Donna Vega ?Procedure Date: 01/23/2022 8:00 AM ?MRN: [id number] ?Date of Birth: 02-15-1966 ?Attending MD: Norvel Richards , MD ?CSN: [id number] ?Age: 56 ?Admit Type: Outpatient ?Procedure:                Colonoscopy ?Indications:              Screening in patient at increased risk: Family  ?                          history of 1st-degree relative with colorectal  ?                          cancer ?Providers:                Norvel Richards, MD, Caprice Kluver, Suzan Garibaldi.  ?                          Risa Grill, Technician ?Referring MD:              ?Medicines:                Propofol per Anesthesia ?Complications:            No immediate complications. ?Estimated Blood Loss:     Estimated blood loss: none. ?Procedure:                Pre-Anesthesia Assessment: ?                          - Prior to the procedure, a History and Physical  ?                          was performed, and patient medications and  ?                          allergies were reviewed. The patient's tolerance of  ?                          previous anesthesia was also reviewed. The risks  ?                          and benefits of the procedure and the sedation  ?                          options and risks were discussed with the patient.  ?                          All questions were answered, and informed consent  ?                          was obtained. Prior Anticoagulants: The patient has  ?                          taken no previous anticoagulant or antiplatelet  ?                          agents. ASA Grade  Assessment: II - A patient with  ?                          mild systemic disease. After reviewing the risks  ?                          and benefits, the patient was deemed in  ?                          satisfactory condition to undergo the procedure. ?                          After obtaining informed consent, the colonoscope  ?                          was passed under direct vision. Throughout  the  ?                          procedure, the patient's blood pressure, pulse, and  ?                          oxygen saturations were monitored continuously. The  ?                          [id number]) scope was introduced through the  ?                          anus and advanced to the the cecum, identified by  ?                          appendiceal orifice and ileocecal valve. The  ?                          colonoscopy was performed without difficulty. The  ?                          patient tolerated the procedure well. The quality  ?                          of the bowel preparation was adequate. ?Scope In: 8:28:23 AM ?Scope Out: 8:44:06 AM ?Scope Withdrawal Time: 0 hours 9 minutes 16 seconds  ?Total Procedure Duration: 0 hours 15 minutes 43 seconds  ?Findings: ?     The perianal and digital rectal examinations were normal. ?     Non-bleeding internal hemorrhoids were found during retroflexion. The  ?     hemorrhoids were mild, small and Grade I (internal hemorrhoids that do  ?     not prolapse). ?     Scattered small-mouthed diverticula were found in the entire colon. ?     The exam was otherwise without abnormality on direct and retroflexion  ?     views. ?Impression:               - Non-bleeding internal hemorrhoids. ?                          -  Diverticulosis in the entire examined colon. ?                          - The examination was otherwise normal on direct  ?                          and retroflexion views. ?                          - No specimens collected. ?Moderate Sedation: ?     Moderate (conscious) sedation was personally administered by an  ?     anesthesia professional. The following parameters were monitored: oxygen  ?     saturation, heart rate, blood pressure, and response to care. ?Recommendation:           - Patient has a contact number available for  ?                          emergencies. The signs and symptoms of potential  ?                          delayed complications  were discussed with the  ?                          patient. Return to normal activities tomorrow.  ?                          Written discharge instructions were provided to the  ?                          patient. ?                          - Resume previous diet. ?                          - Continue present medications. ?                          - Repeat colonoscopy in 5 years for screening  ?                          purposes. ?                          - Return to GI office (date not yet determined). ?                          - Patient has a contact number available for  ?                          emergencies. The signs and symptoms of potential  ?                          delayed complications were discussed with the  ?  patient. Return to normal activities tomorrow.  ?                          Written discharge instructions were provided to the  ?                          patient. ?Procedure Code(s):        --- Professional --- ?                          [phone], Colonoscopy, flexible; diagnostic, including  ?                          collection of specimen(s) by brushing or washing,  ?                          when performed (separate procedure) ?Diagnosis Code(s):        --- Professional --- ?                          Z80.0, Family history of malignant neoplasm of  ?                          digestive organs ?                          K64.0, First degree hemorrhoids ?                          K57.30, Diverticulosis of large intestine without  ?                          perforation or abscess without bleeding ?CPT copyright 2019 American Medical Association. All rights reserved. ?The codes documented in this report are preliminary and upon coder review may  ?be revised to meet current compliance requirements. ?Cristopher Estimable. Nychelle Cassata, MD ?Norvel Richards, MD ?01/23/2022 9:00:29 AM ?This report has been signed electronically. ?Number of Addenda: 0 ?

## 2022-01-23 NOTE — Transfer of Care (Signed)
Immediate Anesthesia Transfer of Care Note ? ?Patient: Donna Vega ? ?Procedure(s) Performed: COLONOSCOPY WITH PROPOFOL ? ?Patient Location: Endoscopy Unit ? ?Anesthesia Type:General ? ?Level of Consciousness: awake and alert  ? ?Airway & Oxygen Therapy: Patient Spontanous Breathing ? ?Post-op Assessment: Report given to RN and Post -op Vital signs reviewed and stable ? ?Post vital signs: Reviewed and stable ? ?Last Vitals:  ?Vitals Value Taken Time  ?BP    ?Temp    ?Pulse 64   ?Resp 19   ?SpO2 96%   ? ? ?Last Pain:  ?Vitals:  ? 01/23/22 0823  ?TempSrc:   ?PainSc: 0-No pain  ?   ? ?Patients Stated Pain Goal: 10 (01/23/22 9914) ? ?Complications: No notable events documented. ?

## 2022-01-23 NOTE — Discharge Instructions (Signed)
?  Colonoscopy ?Discharge Instructions ? ?Read the instructions outlined below and refer to this sheet in the next few weeks. These discharge instructions provide you with general information on caring for yourself after you leave the hospital. Your doctor may also give you specific instructions. While your treatment has been planned according to the most current medical practices available, unavoidable complications occasionally occur. If you have any problems or questions after discharge, call Dr. Gala Romney at [phone]. ?ACTIVITY ?You may resume your regular activity, but move at a slower pace for the next 24 hours.  ?Take frequent rest periods for the next 24 hours.  ?Walking will help get rid of the air and reduce the bloated feeling in your belly (abdomen).  ?No driving for 24 hours (because of the medicine (anesthesia) used during the test).   ?Do not sign any important legal documents or operate any machinery for 24 hours (because of the anesthesia used during the test).  ?NUTRITION ?Drink plenty of fluids.  ?You may resume your normal diet as instructed by your doctor.  ?Begin with a light meal and progress to your normal diet. Heavy or fried foods are harder to digest and may make you feel sick to your stomach (nauseated).  ?Avoid alcoholic beverages for 24 hours or as instructed.  ?MEDICATIONS ?You may resume your normal medications unless your doctor tells you otherwise.  ?WHAT YOU CAN EXPECT TODAY ?Some feelings of bloating in the abdomen.  ?Passage of more gas than usual.  ?Spotting of blood in your stool or on the toilet paper.  ?IF YOU HAD POLYPS REMOVED DURING THE COLONOSCOPY: ?No aspirin products for 7 days or as instructed.  ?No alcohol for 7 days or as instructed.  ?Eat a soft diet for the next 24 hours.  ?FINDING OUT THE RESULTS OF YOUR TEST ?Not all test results are available during your visit. If your test results are not back during the visit, make an appointment with your caregiver to find out the  results. Do not assume everything is normal if you have not heard from your caregiver or the medical facility. It is important for you to follow up on all of your test results.  ?SEEK IMMEDIATE MEDICAL ATTENTION IF: ?You have more than a spotting of blood in your stool.  ?Your belly is swollen (abdominal distention).  ?You are nauseated or vomiting.  ?You have a temperature over 101.  ?You have abdominal pain or discomfort that is severe or gets worse throughout the day.   ? ? ?Diverticulosis information provided ? ?No polyps found today ? ?   It is recommended you return in 5 years for repeat colonoscopy ? ?Patient request, I called Katiria Calame at [phone] -reviewed findings and recommendations. ?

## 2022-01-23 NOTE — Anesthesia Preprocedure Evaluation (Signed)
Anesthesia Evaluation  ?Patient identified by MRN, date of birth, ID band ?Patient awake ? ? ? ?Reviewed: ?Allergy & Precautions, H&P , NPO status , Patient's Chart, lab work & pertinent test results, reviewed documented beta blocker date and time  ? ?Airway ?Mallampati: II ? ?TM Distance: >3 FB ?Neck ROM: full ? ? ? Dental ?no notable dental hx. ? ?  ?Pulmonary ?neg pulmonary ROS,  ?  ?Pulmonary exam normal ?breath sounds clear to auscultation ? ? ? ? ? ? Cardiovascular ?Exercise Tolerance: Good ?negative cardio ROS ? ? ?Rhythm:regular Rate:Normal ? ? ?  ?Neuro/Psych ?PSYCHIATRIC DISORDERS Depression negative neurological ROS ?   ? GI/Hepatic ?Neg liver ROS, GERD  Medicated,  ?Endo/Other  ?negative endocrine ROS ? Renal/GU ?negative Renal ROS  ?negative genitourinary ?  ?Musculoskeletal ? ? Abdominal ?  ?Peds ? Hematology ?negative hematology ROS ?(+)   ?Anesthesia Other Findings ? ? Reproductive/Obstetrics ?negative OB ROS ? ?  ? ? ? ? ? ? ? ? ? ? ? ? ? ?  ?  ? ? ? ? ? ? ? ? ?Anesthesia Physical ?Anesthesia Plan ? ?ASA: 2 ? ?Anesthesia Plan: General  ? ?Post-op Pain Management:   ? ?Induction:  ? ?PONV Risk Score and Plan: Propofol infusion ? ?Airway Management Planned:  ? ?Additional Equipment:  ? ?Intra-op Plan:  ? ?Post-operative Plan:  ? ?Informed Consent: I have reviewed the patients History and Physical, chart, labs and discussed the procedure including the risks, benefits and alternatives for the proposed anesthesia with the patient or authorized representative who has indicated his/her understanding and acceptance.  ? ? ? ?Dental Advisory Given ? ?Plan Discussed with: CRNA ? ?Anesthesia Plan Comments:   ? ? ? ? ? ? ?Anesthesia Quick Evaluation ? ?

## 2022-01-28 ENCOUNTER — Encounter (HOSPITAL_COMMUNITY): Payer: Self-pay | Admitting: Internal Medicine

## 2022-06-30 ENCOUNTER — Ambulatory Visit: Payer: 59 | Admitting: Gastroenterology

## 2022-08-24 DIAGNOSIS — E559 Vitamin D deficiency, unspecified: Secondary | ICD-10-CM | POA: Diagnosis not present

## 2022-08-24 DIAGNOSIS — Z1211 Encounter for screening for malignant neoplasm of colon: Secondary | ICD-10-CM | POA: Diagnosis not present

## 2022-08-24 DIAGNOSIS — R7303 Prediabetes: Secondary | ICD-10-CM | POA: Diagnosis not present

## 2022-08-24 DIAGNOSIS — E785 Hyperlipidemia, unspecified: Secondary | ICD-10-CM | POA: Diagnosis not present

## 2022-08-24 DIAGNOSIS — F329 Major depressive disorder, single episode, unspecified: Secondary | ICD-10-CM | POA: Diagnosis not present

## 2022-08-24 DIAGNOSIS — I10 Essential (primary) hypertension: Secondary | ICD-10-CM | POA: Diagnosis not present

## 2022-08-24 DIAGNOSIS — Z23 Encounter for immunization: Secondary | ICD-10-CM | POA: Diagnosis not present

## 2022-08-24 DIAGNOSIS — Z6832 Body mass index (BMI) 32.0-32.9, adult: Secondary | ICD-10-CM | POA: Diagnosis not present

## 2022-08-31 DIAGNOSIS — E785 Hyperlipidemia, unspecified: Secondary | ICD-10-CM | POA: Diagnosis not present

## 2022-08-31 DIAGNOSIS — I10 Essential (primary) hypertension: Secondary | ICD-10-CM | POA: Diagnosis not present

## 2022-08-31 DIAGNOSIS — E559 Vitamin D deficiency, unspecified: Secondary | ICD-10-CM | POA: Diagnosis not present

## 2022-11-30 ENCOUNTER — Other Ambulatory Visit (HOSPITAL_COMMUNITY): Payer: Self-pay | Admitting: Family Medicine

## 2022-11-30 DIAGNOSIS — Z1231 Encounter for screening mammogram for malignant neoplasm of breast: Secondary | ICD-10-CM

## 2022-12-01 DIAGNOSIS — E559 Vitamin D deficiency, unspecified: Secondary | ICD-10-CM | POA: Diagnosis not present

## 2022-12-01 DIAGNOSIS — I10 Essential (primary) hypertension: Secondary | ICD-10-CM | POA: Diagnosis not present

## 2022-12-01 DIAGNOSIS — E785 Hyperlipidemia, unspecified: Secondary | ICD-10-CM | POA: Diagnosis not present

## 2022-12-11 ENCOUNTER — Ambulatory Visit (HOSPITAL_COMMUNITY)
Admission: RE | Admit: 2022-12-11 | Discharge: 2022-12-11 | Disposition: A | Discharge status: Home / Self Care | Payer: Commercial Managed Care - PPO | Source: Ambulatory Visit | Attending: Family Medicine | Admitting: Family Medicine

## 2022-12-11 DIAGNOSIS — Z1231 Encounter for screening mammogram for malignant neoplasm of breast: Secondary | ICD-10-CM | POA: Diagnosis not present

## 2022-12-16 ENCOUNTER — Other Ambulatory Visit (HOSPITAL_COMMUNITY): Payer: Self-pay | Admitting: Family Medicine

## 2022-12-16 DIAGNOSIS — R928 Other abnormal and inconclusive findings on diagnostic imaging of breast: Secondary | ICD-10-CM

## 2023-01-07 ENCOUNTER — Encounter (HOSPITAL_COMMUNITY): Payer: Self-pay

## 2023-01-07 ENCOUNTER — Ambulatory Visit (HOSPITAL_COMMUNITY)
Admission: RE | Admit: 2023-01-07 | Discharge: 2023-01-07 | Disposition: A | Discharge status: Home / Self Care | Payer: Commercial Managed Care - PPO | Source: Ambulatory Visit | Attending: Family Medicine | Admitting: Family Medicine

## 2023-01-07 DIAGNOSIS — R928 Other abnormal and inconclusive findings on diagnostic imaging of breast: Secondary | ICD-10-CM

## 2023-02-24 DIAGNOSIS — E559 Vitamin D deficiency, unspecified: Secondary | ICD-10-CM | POA: Diagnosis not present

## 2023-02-24 DIAGNOSIS — F418 Other specified anxiety disorders: Secondary | ICD-10-CM | POA: Diagnosis not present

## 2023-02-24 DIAGNOSIS — Z6835 Body mass index (BMI) 35.0-35.9, adult: Secondary | ICD-10-CM | POA: Diagnosis not present

## 2023-02-24 DIAGNOSIS — M7701 Medial epicondylitis, right elbow: Secondary | ICD-10-CM | POA: Diagnosis not present

## 2023-02-24 DIAGNOSIS — N289 Disorder of kidney and ureter, unspecified: Secondary | ICD-10-CM | POA: Diagnosis not present

## 2023-02-24 DIAGNOSIS — R7303 Prediabetes: Secondary | ICD-10-CM | POA: Diagnosis not present

## 2023-02-24 DIAGNOSIS — Z7182 Exercise counseling: Secondary | ICD-10-CM | POA: Diagnosis not present

## 2023-02-24 DIAGNOSIS — I10 Essential (primary) hypertension: Secondary | ICD-10-CM | POA: Diagnosis not present

## 2023-02-24 DIAGNOSIS — E785 Hyperlipidemia, unspecified: Secondary | ICD-10-CM | POA: Diagnosis not present

## 2023-02-24 DIAGNOSIS — Z713 Dietary counseling and surveillance: Secondary | ICD-10-CM | POA: Diagnosis not present

## 2023-03-05 DIAGNOSIS — R7303 Prediabetes: Secondary | ICD-10-CM | POA: Diagnosis not present

## 2023-03-05 DIAGNOSIS — E785 Hyperlipidemia, unspecified: Secondary | ICD-10-CM | POA: Diagnosis not present

## 2023-03-05 DIAGNOSIS — N289 Disorder of kidney and ureter, unspecified: Secondary | ICD-10-CM | POA: Diagnosis not present

## 2023-05-19 ENCOUNTER — Other Ambulatory Visit: Payer: Self-pay | Admitting: Oncology

## 2023-05-19 DIAGNOSIS — Z006 Encounter for examination for normal comparison and control in clinical research program: Secondary | ICD-10-CM

## 2023-05-21 ENCOUNTER — Other Ambulatory Visit (HOSPITAL_COMMUNITY)
Admission: RE | Admit: 2023-05-21 | Discharge: 2023-05-21 | Disposition: A | Discharge status: Home / Self Care | Payer: Commercial Managed Care - PPO | Source: Ambulatory Visit | Attending: Oncology | Admitting: Oncology

## 2023-05-21 DIAGNOSIS — Z006 Encounter for examination for normal comparison and control in clinical research program: Secondary | ICD-10-CM | POA: Insufficient documentation

## 2023-06-27 ENCOUNTER — Encounter: Payer: Self-pay | Admitting: Emergency Medicine

## 2023-06-27 ENCOUNTER — Ambulatory Visit: Payer: Commercial Managed Care - PPO

## 2023-06-27 ENCOUNTER — Ambulatory Visit
Admission: EM | Admit: 2023-06-27 | Discharge: 2023-06-27 | Disposition: A | Discharge status: Home / Self Care | Payer: Commercial Managed Care - PPO

## 2023-06-27 DIAGNOSIS — L282 Other prurigo: Secondary | ICD-10-CM

## 2023-06-27 DIAGNOSIS — S30861A Insect bite (nonvenomous) of abdominal wall, initial encounter: Secondary | ICD-10-CM

## 2023-06-27 DIAGNOSIS — W57XXXA Bitten or stung by nonvenomous insect and other nonvenomous arthropods, initial encounter: Secondary | ICD-10-CM | POA: Diagnosis not present

## 2023-06-27 HISTORY — DX: Essential (primary) hypertension: I10

## 2023-06-27 MED ORDER — PREDNISONE 20 MG PO TABS: 40.0000 mg | ORAL_TABLET | Freq: Every day | ORAL | 0 refills | Status: AC

## 2023-06-27 MED ORDER — DEXAMETHASONE SODIUM PHOSPHATE 10 MG/ML IJ SOLN
10.0000 mg | INTRAMUSCULAR | Status: AC
  Administered 2023-06-27: 10 mg via INTRAMUSCULAR

## 2023-06-27 MED ORDER — DOXYCYCLINE HYCLATE 100 MG PO TABS: 100.0000 mg | ORAL_TABLET | Freq: Two times a day (BID) | ORAL | 0 refills | Status: AC

## 2023-06-27 NOTE — ED Provider Notes (Signed)
[facility]    CSN: [id number] Arrival date & time: 06/27/23  1037      History   Chief Complaint No chief complaint on file.   HPI Annaliyah Sholtis Barrington is a 57 y.o. female.   The history is provided by the patient.   Patient presents for complaints of a possible insect bite to her abdomen.  Patient states symptoms started over the past 3 to 4 days.  Patient states that the area on her abdomen is red, warm to touch, tender, and has a black center in the middle.  Patient denies fever, chills, chest pain, abdominal pain, nausea, vomiting, or diarrhea.  Reports she has been using alcohol on the area.   Past Medical History:  Diagnosis Date   Depression    Eating disorder    GERD (gastroesophageal reflux disease)    Hypercholesteremia    Hypertension    Melanoma (HCC)    left leg    Patient Active Problem List   Diagnosis Date Noted   Pancreatitis 09/28/2019   Acute gallstone pancreatitis 09/28/2019   Abnormal liver function 09/28/2019   GERD (gastroesophageal reflux disease) 09/28/2019   Abdominal pain, epigastric 09/20/2019   Depression 07/10/2019   History of melanoma 07/10/2019   Eating disorder 10/03/2012   Stress 10/03/2012   Screening for breast cancer 10/03/2012    Past Surgical History:  Procedure Laterality Date   CHOLECYSTECTOMY N/A 10/25/2019   Procedure: LAPAROSCOPIC CHOLECYSTECTOMY;  Surgeon: Lucretia Roers, MD;  Location: [facility];  Service: General;  Laterality: N/A;   COLONOSCOPY WITH PROPOFOL N/A 01/23/2022   Procedure: COLONOSCOPY WITH PROPOFOL;  Surgeon: Corbin Ade, MD;  Location: [facility];  Service: Endoscopy;  Laterality: N/A;  9:00 / ASA 2   ERCP N/A 09/29/2019   Procedure: ENDOSCOPIC RETROGRADE CHOLANGIOPANCREATOGRAPHY (ERCP) WITH SPHINCTEROTOMY AND STONE EXTRACTION;  Surgeon: Malissa Hippo, MD;  Location: [facility];  Service: Endoscopy;  Laterality: N/A;   TUBAL LIGATION  1997    OB History   No obstetric history  on file.      Home Medications    Prior to Admission medications   Medication Sig Start Date End Date Taking? Authorizing Provider  buPROPion (WELLBUTRIN) 100 MG tablet Take 100 mg by mouth 2 (two) times daily.   Yes [provider]  cholecalciferol (VITAMIN D3) 25 MCG (1000 UNIT) tablet Take 1,000 Units by mouth daily.   Yes [provider]  hydrOXYzine (ATARAX) 10 MG tablet Take 10 mg by mouth 3 (three) times daily as needed.   Yes [provider]  losartan-hydrochlorothiazide (HYZAAR) 50-12.5 MG tablet Take 1 tablet by mouth daily.   Yes [provider]  simvastatin (ZOCOR) 10 MG tablet Take 10 mg by mouth daily.   Yes [provider]  acetaminophen (TYLENOL) 325 MG tablet Take 2 tablets (650 mg total) by mouth every 6 (six) hours as needed for mild pain or headache (or Fever >/= 101). 10/04/19   Vassie Loll, MD  metFORMIN (GLUCOPHAGE) 500 MG tablet Take 500 mg by mouth daily. 10/23/21   [provider]    Family History Family History  Problem Relation Age of Onset   Colon cancer Mother 4       colon    Hypertension Father    Hyperlipidemia Father    Liver disease Neg Hx     Social History Social History   Tobacco Use   Smoking status: Never   Smokeless tobacco: Never  Vaping  Use   Vaping status: Never Used  Substance Use Topics   Alcohol use: Not Currently    Comment: stopped drinking 2 months ago as of 10/23/2019   Drug use: No     Allergies   Patient has no known allergies.   Review of Systems Review of Systems Per HPI  Physical Exam Triage Vital Signs ED Triage Vitals [06/27/23 1122]  Encounter Vitals Group     BP 127/82     Systolic BP Percentile      Diastolic BP Percentile      Pulse Rate 71     Resp 16     Temp 98.9 F (37.2 C)     Temp Source Oral     SpO2 99 %     Weight      Height      Head Circumference      Peak Flow      Pain Score 8     Pain Loc      Pain Education       Exclude from Growth Chart    No data found.  Updated Vital Signs BP 127/82 (BP Location: Right Arm)   Pulse 71   Temp 98.9 F (37.2 C) (Oral)   Resp 16   SpO2 99%   Visual Acuity Right Eye Distance:   Left Eye Distance:   Bilateral Distance:    Right Eye Near:   Left Eye Near:    Bilateral Near:     Physical Exam Vitals and nursing note reviewed.  Constitutional:      General: She is not in acute distress.    Appearance: Normal appearance.  HENT:     Head: Normocephalic.  Eyes:     Extraocular Movements: Extraocular movements intact.     Pupils: Pupils are equal, round, and reactive to light.  Cardiovascular:     Rate and Rhythm: Normal rate and regular rhythm.     Pulses: Normal pulses.     Heart sounds: Normal heart sounds.  Pulmonary:     Effort: Pulmonary effort is normal.     Breath sounds: Normal breath sounds.  Abdominal:     General: Bowel sounds are normal.     Palpations: Abdomen is soft.     Tenderness: There is no abdominal tenderness.  Musculoskeletal:     Cervical back: Normal range of motion.  Lymphadenopathy:     Cervical: No cervical adenopathy.  Skin:    General: Skin is warm and dry.       Neurological:     General: No focal deficit present.     Mental Status: She is alert and oriented to person, place, and time.  Psychiatric:        Mood and Affect: Mood normal.        Behavior: Behavior normal.      UC Treatments / Results  Labs (all labs ordered are listed, but only abnormal results are displayed) Labs Reviewed - No data to display  EKG   Radiology No results found.  Procedures Procedures (including critical care time)  Medications Ordered in UC Medications  dexamethasone (DECADRON) injection 10 mg (has no administration in time range)    Initial Impression / Assessment and Plan / UC Course  I have reviewed the triage vital signs and the nursing notes.  Pertinent labs & imaging results that were available during  my care of the patient were reviewed by me and considered in my medical decision making (see chart for  details).  The patient is well up during, she is in no acute distress, vital signs are stable.  Patient with possible insect bite to the right lower abdomen.  Area is erythematous and tender to palpation.  With an area of erythema, there is an induration with noted firmness present.  Decadron 10 mg IM administered in the clinic.  Will treat patient empirically with doxycycline 100 mg for the next 5 days along with prednisone 40 mg.  Supportive care recommendations were provided and discussed with the patient to include over-the-counter antihistamines for itching, over-the-counter analgesics for pain or discomfort, and cool compresses to the area.  Patient advised to monitor the area for worsening, if so recommend following up in this clinic or with her primary care physician for further evaluation.  Patient is in agreement with this plan of care and verbalizes understanding.  All questions were answered.  Patient stable for discharge.   Final Clinical Impressions(s) / UC Diagnoses   Final diagnoses:  None   Discharge Instructions   None    ED Prescriptions   None    PDMP not reviewed this encounter.   Abran Cantor, NP 06/27/23 1210

## 2023-06-27 NOTE — ED Triage Notes (Signed)
Insect bite to right lower abd x 4 days.  Large area red and inflamed with a black center in the middle and feel warm to the touch.

## 2023-06-27 NOTE — Discharge Instructions (Signed)
You have been given an injection of Decadron 10 mg today.  Start the prednisone on 06/28/2023. Take medication as prescribed. May apply cool compresses to the area to help with pain or swelling. May take over-the-counter Tylenol or ibuprofen as needed for pain, fever, or general discomfort. Do not scratch or irritate the area while symptoms persist. May take over-the-counter Zyrtec during the daytime or Benadryl at bedtime to help with itching. Continue to monitor the area for worsening, if symptoms appear to be worsening, please follow-up in this clinic or with your primary care physician for further evaluation. Follow-up as needed.

## 2023-06-28 DIAGNOSIS — E669 Obesity, unspecified: Secondary | ICD-10-CM | POA: Diagnosis not present

## 2023-06-28 DIAGNOSIS — R52 Pain, unspecified: Secondary | ICD-10-CM | POA: Diagnosis not present

## 2023-06-28 DIAGNOSIS — S30861D Insect bite (nonvenomous) of abdominal wall, subsequent encounter: Secondary | ICD-10-CM | POA: Diagnosis not present

## 2023-06-28 DIAGNOSIS — Z713 Dietary counseling and surveillance: Secondary | ICD-10-CM | POA: Diagnosis not present

## 2023-06-28 DIAGNOSIS — Z7182 Exercise counseling: Secondary | ICD-10-CM | POA: Diagnosis not present

## 2023-06-29 ENCOUNTER — Emergency Department (HOSPITAL_COMMUNITY)
Admission: EM | Admit: 2023-06-29 | Discharge: 2023-06-29 | Disposition: A | Discharge status: Home / Self Care | Payer: Commercial Managed Care - PPO | Attending: Emergency Medicine | Admitting: Emergency Medicine

## 2023-06-29 ENCOUNTER — Encounter (HOSPITAL_COMMUNITY): Payer: Self-pay | Admitting: Emergency Medicine

## 2023-06-29 DIAGNOSIS — I10 Essential (primary) hypertension: Secondary | ICD-10-CM | POA: Insufficient documentation

## 2023-06-29 DIAGNOSIS — W57XXXA Bitten or stung by nonvenomous insect and other nonvenomous arthropods, initial encounter: Secondary | ICD-10-CM | POA: Insufficient documentation

## 2023-06-29 DIAGNOSIS — L02211 Cutaneous abscess of abdominal wall: Secondary | ICD-10-CM | POA: Diagnosis not present

## 2023-06-29 DIAGNOSIS — D72829 Elevated white blood cell count, unspecified: Secondary | ICD-10-CM | POA: Diagnosis not present

## 2023-06-29 DIAGNOSIS — Z79899 Other long term (current) drug therapy: Secondary | ICD-10-CM | POA: Insufficient documentation

## 2023-06-29 LAB — BASIC METABOLIC PANEL
Anion gap: 11 (ref 5–15)
BUN: 21 mg/dL — ABNORMAL HIGH (ref 6–20)
CO2: 23 mmol/L (ref 22–32)
Calcium: 8.9 mg/dL (ref 8.9–10.3)
Chloride: 102 mmol/L (ref 98–111)
Creatinine, Ser: 1.06 mg/dL — ABNORMAL HIGH (ref 0.44–1.00)
GFR, Estimated: >60 mL/min (ref >60)
Glucose, Bld: 128 mg/dL — ABNORMAL HIGH (ref 70–99)
Potassium: 3.9 mmol/L (ref 3.5–5.1)
Sodium: 136 mmol/L (ref 135–145)

## 2023-06-29 LAB — CBC WITH DIFFERENTIAL/PLATELET
Abs Immature Granulocytes: 0.06 10*3/uL (ref 0.00–0.07)
Basophils Absolute: 0 10*3/uL (ref 0.0–0.1)
Basophils Relative: 0 %
Eosinophils Absolute: 0 10*3/uL (ref 0.0–0.5)
Eosinophils Relative: 0 %
HCT: 40.6 % (ref 36.0–46.0)
Hemoglobin: 13.4 g/dL (ref 12.0–15.0)
Immature Granulocytes: 1 %
Lymphocytes Relative: 7 %
Lymphs Abs: 1 10*3/uL (ref 0.7–4.0)
MCH: 29.7 pg (ref 26.0–34.0)
MCHC: 33 g/dL (ref 30.0–36.0)
MCV: 90 fL (ref 80.0–100.0)
Monocytes Absolute: 0.3 10*3/uL (ref 0.1–1.0)
Monocytes Relative: 3 %
Neutro Abs: 11.6 10*3/uL — ABNORMAL HIGH (ref 1.7–7.7)
Neutrophils Relative %: 89 %
Platelets: 187 10*3/uL (ref 150–400)
RBC: 4.51 MIL/uL (ref 3.87–5.11)
RDW: 13 % (ref 11.5–15.5)
WBC: 13 10*3/uL — ABNORMAL HIGH (ref 4.0–10.5)
nRBC: 0 % (ref 0.0–0.2)

## 2023-06-29 MED ORDER — HYDROCODONE-ACETAMINOPHEN 5-325 MG PO TABS
1.0000 | ORAL_TABLET | Freq: Once | ORAL | Status: AC
  Administered 2023-06-29: 1 via ORAL
  Filled 2023-06-29: qty 1

## 2023-06-29 MED ORDER — LIDOCAINE-EPINEPHRINE (PF) 1 %-1:200000 IJ SOLN
30.0000 mL | Freq: Once | INTRAMUSCULAR | Status: DC
  Filled 2023-06-29: qty 30

## 2023-06-29 NOTE — ED Notes (Signed)
ED Provider at bedside. 

## 2023-06-29 NOTE — ED Triage Notes (Signed)
Pt reports spider bite to right lower abd about 5 days ago. Pt seen at UC 2 days ago started on abx.

## 2023-06-29 NOTE — Discharge Instructions (Addendum)
It was a pleasure taking care of you today.  You were seen for an abscess on your abdomen.  Continue the doxycycline and Keflex you have been taking.  Keep the area clean and dry.  He should have this rechecked by your primary care doctor in about 2 days.  If you develop fever, increased redness, increased pain or any other worsening symptoms come back to the ER right away.  Warm compresses may help with coverage of the drainage to continue.

## 2023-06-29 NOTE — ED Provider Notes (Signed)
[place] EMERGENCY DEPARTMENT AT [facility] Provider Note   CSN: [id number] Arrival date & time: 06/29/23  1602     History  Chief Complaint  Patient presents with   Insect Bite    Donna Vega is a 57 y.o. female.  She has PMH of, high cholesterol, anxiety, HTN.  Presents to ER for abscess to right lower quadrant of her abdomen, started 5 days ago, seen urgent care 2 days ago,  HPI     Home Medications Prior to Admission medications   Medication Sig Start Date End Date Taking? Authorizing Provider  acetaminophen (TYLENOL) 325 MG tablet Take 2 tablets (650 mg total) by mouth every 6 (six) hours as needed for mild pain or headache (or Fever >/= 101). 10/04/19   Vassie Loll, MD  buPROPion (WELLBUTRIN) 100 MG tablet Take 100 mg by mouth 2 (two) times daily.    [provider]  cholecalciferol (VITAMIN D3) 25 MCG (1000 UNIT) tablet Take 1,000 Units by mouth daily.    [provider]  doxycycline (VIBRA-TABS) 100 MG tablet Take 1 tablet (100 mg total) by mouth 2 (two) times daily for 5 days. 06/27/23 07/02/23  Leath-Warren, Sadie Haber, NP  hydrOXYzine (ATARAX) 10 MG tablet Take 10 mg by mouth 3 (three) times daily as needed.    [provider]  losartan-hydrochlorothiazide (HYZAAR) 50-12.5 MG tablet Take 1 tablet by mouth daily.    [provider]  metFORMIN (GLUCOPHAGE) 500 MG tablet Take 500 mg by mouth daily. 10/23/21   [provider]  predniSONE (DELTASONE) 20 MG tablet Take 2 tablets (40 mg total) by mouth daily with breakfast for 5 days. 06/27/23 07/02/23  Leath-Warren, Sadie Haber, NP  simvastatin (ZOCOR) 10 MG tablet Take 10 mg by mouth daily.    [provider]      Allergies    Patient has no known allergies.    Review of Systems   Review of Systems  Physical Exam Updated Vital Signs BP (!) 146/68 (BP Location: Right Arm)   Pulse 66   Temp 98.1 F (36.7 C) (Oral)   Resp 17   Ht 5\' 8"  (1.727 m)    Wt 92 kg   SpO2 98%   BMI 30.84 kg/m  Physical Exam Vitals and nursing note reviewed.  Constitutional:      General: She is not in acute distress.    Appearance: She is well-developed.  HENT:     Head: Normocephalic and atraumatic.     Mouth/Throat:     Mouth: Mucous membranes are moist.  Eyes:     Conjunctiva/sclera: Conjunctivae normal.  Cardiovascular:     Rate and Rhythm: Normal rate and regular rhythm.     Heart sounds: No murmur heard. Pulmonary:     Effort: Pulmonary effort is normal. No respiratory distress.     Breath sounds: Normal breath sounds.  Abdominal:     Palpations: Abdomen is soft.     Tenderness: There is no abdominal tenderness.  Musculoskeletal:        General: No swelling.     Cervical back: Neck supple.  Skin:    General: Skin is warm and dry.     Capillary Refill: Capillary refill takes less than 2 seconds.     Comments: Abscess with central opening right lower quadrant abdomen that is 2 cm in diameter with surrounding cellulitis approximately  6 cm in diameter  Neurological:     Mental Status: She is alert.  Psychiatric:  Mood and Affect: Mood normal.     ED Results / Procedures / Treatments   Labs (all labs ordered are listed, but only abnormal results are displayed) Labs Reviewed  CBC WITH DIFFERENTIAL/PLATELET - Abnormal; Notable for the following components:      Result Value   WBC 13.0 (*)    Neutro Abs 11.6 (*)    All other components within normal limits  BASIC METABOLIC PANEL - Abnormal; Notable for the following components:   Glucose, Bld 128 (*)    BUN 21 (*)    Creatinine, Ser 1.06 (*)    All other components within normal limits    EKG None  Radiology No results found.  Procedures .Marland KitchenIncision and Drainage  Date/Time: 06/29/2023 5:34 PM  Performed by: Ma Rings, PA-C Authorized by: Ma Rings, PA-C   Consent:    Consent obtained:  Verbal   Consent given by:  Patient   Risks, benefits, and  alternatives were discussed: yes     Risks discussed:  Bleeding, incomplete drainage and pain   Alternatives discussed:  No treatment Universal protocol:    Procedure explained and questions answered to patient or proxy's satisfaction: yes     Test results available : yes     Patient identity confirmed:  Verbally with patient Location:    Type:  Abscess   Size:  2 cm Pre-procedure details:    Skin preparation:  Povidone-iodine Sedation:    Sedation type:  None Anesthesia:    Anesthesia method:  Local infiltration   Local anesthetic:  Lidocaine 1% WITH epi Procedure type:    Complexity:  Complex Procedure details:    Ultrasound guidance: yes     Needle aspiration: no     Incision types:  Single straight   Incision depth:  Subcutaneous   Wound management:  Probed and deloculated   Drainage:  Purulent   Drainage amount:  Moderate   Wound treatment:  Wound left open   Packing materials:  None Post-procedure details:    Procedure completion:  Tolerated well, no immediate complications     Medications Ordered in ED Medications  lidocaine-EPINEPHrine (PF) (XYLOCAINE-EPINEPHrine) 1 %-1:200000 (PF) injection 30 mL (has no administration in time range)  HYDROcodone-acetaminophen (NORCO/VICODIN) 5-325 MG per tablet 1 tablet (1 tablet Oral Given 06/29/23 1708)    ED Course/ Medical Decision Making/ A&P                                 Medical Decision Making Amount and/or Complexity of Data Reviewed Labs: ordered. Radiology: ordered.   Dx: Abscess, cellulitis, necrotizing fasciitis, other  ED course: Patient presents with abdominal wall infection, is on doxycycline and Keflex x 2 days, got IM Rocephin yesterday at her PCPs office as well and has increasing redness.  No fevers, she is well-appearing, having only localized tenderness with no crepitus.  Do not suspect necrotizing infection.  Labs show mild leukocytosis but she does not meet any other sirs criteria.  Otherwise  reassuring.  Patient got relief with incision and drainage, feeling better, advised on warm compresses, follow-up for wound check in 2 days and strict return precautions.  Considered a CT scan for further delineation of depth but seems fairly superficial at the center, bedside ultrasound did not show any deeper abscess, just some cobblestoning of the subcutaneous tissue        Final Clinical Impression(s) / ED Diagnoses Final diagnoses:  Cutaneous  abscess of abdominal wall    Rx / DC Orders ED Discharge Orders     None         Josem Kaufmann 06/29/23 1739    Eber Hong, MD 06/29/23 2217

## 2023-09-13 DIAGNOSIS — S6991XA Unspecified injury of right wrist, hand and finger(s), initial encounter: Secondary | ICD-10-CM | POA: Diagnosis not present

## 2023-09-13 DIAGNOSIS — I10 Essential (primary) hypertension: Secondary | ICD-10-CM | POA: Diagnosis not present

## 2023-09-13 DIAGNOSIS — N289 Disorder of kidney and ureter, unspecified: Secondary | ICD-10-CM | POA: Diagnosis not present

## 2023-09-13 DIAGNOSIS — R7303 Prediabetes: Secondary | ICD-10-CM | POA: Diagnosis not present

## 2023-09-13 DIAGNOSIS — Z713 Dietary counseling and surveillance: Secondary | ICD-10-CM | POA: Diagnosis not present

## 2023-09-13 DIAGNOSIS — Z7182 Exercise counseling: Secondary | ICD-10-CM | POA: Diagnosis not present

## 2023-09-13 DIAGNOSIS — E559 Vitamin D deficiency, unspecified: Secondary | ICD-10-CM | POA: Diagnosis not present

## 2023-09-13 DIAGNOSIS — E785 Hyperlipidemia, unspecified: Secondary | ICD-10-CM | POA: Diagnosis not present

## 2023-09-13 DIAGNOSIS — F418 Other specified anxiety disorders: Secondary | ICD-10-CM | POA: Diagnosis not present

## 2023-09-13 DIAGNOSIS — E669 Obesity, unspecified: Secondary | ICD-10-CM | POA: Diagnosis not present

## 2023-09-17 ENCOUNTER — Ambulatory Visit (HOSPITAL_COMMUNITY)
Admission: RE | Admit: 2023-09-17 | Discharge: 2023-09-17 | Disposition: A | Discharge status: Home / Self Care | Payer: Commercial Managed Care - PPO | Source: Ambulatory Visit | Attending: Family Medicine | Admitting: Family Medicine

## 2023-09-17 ENCOUNTER — Other Ambulatory Visit (HOSPITAL_COMMUNITY): Payer: Self-pay | Admitting: Family Medicine

## 2023-09-17 DIAGNOSIS — M20011 Mallet finger of right finger(s): Secondary | ICD-10-CM

## 2023-09-17 DIAGNOSIS — S62636A Displaced fracture of distal phalanx of right little finger, initial encounter for closed fracture: Secondary | ICD-10-CM | POA: Diagnosis not present

## 2023-09-24 DIAGNOSIS — E559 Vitamin D deficiency, unspecified: Secondary | ICD-10-CM | POA: Diagnosis not present

## 2023-09-24 DIAGNOSIS — N289 Disorder of kidney and ureter, unspecified: Secondary | ICD-10-CM | POA: Diagnosis not present

## 2023-09-24 DIAGNOSIS — R7303 Prediabetes: Secondary | ICD-10-CM | POA: Diagnosis not present

## 2023-09-24 DIAGNOSIS — E785 Hyperlipidemia, unspecified: Secondary | ICD-10-CM | POA: Diagnosis not present

## 2023-10-15 DIAGNOSIS — S62600A Fracture of unspecified phalanx of right index finger, initial encounter for closed fracture: Secondary | ICD-10-CM | POA: Diagnosis not present

## 2023-10-15 DIAGNOSIS — S62636A Displaced fracture of distal phalanx of right little finger, initial encounter for closed fracture: Secondary | ICD-10-CM | POA: Diagnosis not present

## 2023-10-15 DIAGNOSIS — M79644 Pain in right finger(s): Secondary | ICD-10-CM | POA: Diagnosis not present

## 2023-11-17 ENCOUNTER — Telehealth: Payer: Self-pay | Admitting: Orthopedic Surgery

## 2023-11-17 NOTE — Telephone Encounter (Signed)
 Pt called in stating she has a finger Fx and would like to consult for surgery she prev went to [facility] and they scheduled her without telling her she was OON with her insurance, she did have xrays done at [facility] she would like to know if she can be worked in so she can go ahead and get surgery. Please advise if she can be worked in, Xray was done on 11/08 for little right finger scheduled her for Next avail 12/14/23

## 2023-11-18 NOTE — Telephone Encounter (Signed)
 Spoke with patient;  Injury happened 2.5 months ago Xrays obtained by her PCP at [facility] She went and saw [facility], Dr. Camella who re-xrayed and stated she needed surgery Surgery was then scheduled for December 20th. Patient found out that they are not apart of [facility] and did not want to pay the price they were asking of her.  She cancelled the surgery, and reached out to our office on January 8th for an urgent appointment. She is not wearing any type of splint on the finger.  I have reached out to Dr. Erwin to review chart,xrays to see about getting sooner appointment.  Will reach out to patient once this is done

## 2023-11-22 ENCOUNTER — Other Ambulatory Visit (INDEPENDENT_AMBULATORY_CARE_PROVIDER_SITE_OTHER): Payer: Commercial Managed Care - PPO

## 2023-11-22 ENCOUNTER — Ambulatory Visit (INDEPENDENT_AMBULATORY_CARE_PROVIDER_SITE_OTHER): Payer: Commercial Managed Care - PPO | Admitting: Orthopedic Surgery

## 2023-11-22 DIAGNOSIS — M20011 Mallet finger of right finger(s): Secondary | ICD-10-CM

## 2023-11-22 NOTE — Progress Notes (Signed)
 Donna Vega - 58 y.o. female MRN [id number]  Date of birth: 1966/08/18  Office Visit Note: Visit Date: 11/22/2023 PCP: Donna Silvio BROCKS, FNP Referred by: Donna Silvio BROCKS, FNP  Subjective: No chief complaint on file.  HPI: Donna Vega is a pleasant 58 y.o. female who presents today for evaluation of right small finger mallet injury sustained approximate 10 weeks prior.  She initially tried splinting for a few days, has since discontinued.  Has ongoing deformity at the small finger DIP joint, unable to perform active extension.  She is also describing some ongoing shoulder tightness over the past 1 month.  No prior history of frozen shoulder.  Nondiabetic, no thyroid  history.  Pertinent ROS were reviewed with the patient and found to be negative unless otherwise specified above in HPI.   Visit Reason: right small finger mallet injury Duration of symptoms: 10 weeks Hand dominance: right Occupation: [employer]  Diabetic: No Smoking: No Heart/Lung History: none Blood Thinners:  none  Prior Testing/EMG: 10/06/23 Injections (Date): none Treatments:none Prior Surgery: none  Assessment & Plan: Visit Diagnoses:  1. Acquired mallet finger of right hand     Plan: Extensive discussion was had with the patient today regarding her right small finger bony mallet injury.  Repeat x-rays today were taken which show notable bony fragment consistent with a mallet injury, no significant joint subluxation.  She does have passive motion of the DIP joint, joint remains supple.  We discussed treatment options ranging from conservative to surgical.  From a conservative standpoint, given that she does have a bony injury, she is an appropriate candidate for extension splinting at the DIP to attempt for bony union.  We did discuss the chronicity of her injury which may preclude her ability to heal appropriately with nonoperative measures.  From a surgical standpoint, we discussed extension block  pinning as well as all risks and benefits associated.  For the time being, she would like to trial conservative measures with mallet splinting which I am in agreement with.  Mallet splint was fitted today for extension, I did emphasize that even outside of the splint she should maintain extension of the small finger with her thumb, during hygiene.  I will plan to see her back in approximately 4 weeks time for repeat clinical and radiographic check.  Mallet splint was placed today with the DIP in full extension in order to correct the ongoing deformity.  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  Her external rotation is limited as is her abduction currently.  I offered her referral to one of our shoulder specialist to have this looked at and maybe institute some therapy, for the time being, she would like to try range of motion exercises on her own and will contact our office should this continue to evolve or worsen.  Follow-up: No follow-ups on file.   Meds & Orders: No orders of the defined types were placed in this encounter.   Orders Placed This Encounter  Procedures   XR Finger Little Right     Procedures: No procedures performed      Clinical History: No specialty comments available.  She reports that she has never smoked. She has never used smokeless tobacco. No results for input(s): HGBA1C, LABURIC in the last 8760 hours.  Objective:   Vital Signs: There were no vitals taken for this visit.  Physical Exam  Gen: Well-appearing, in no acute distress;  non-toxic CV: Regular Rate. Well-perfused. Warm.  Resp: Breathing unlabored on room air; no wheezing. Psych: Fluid speech in conversation; appropriate affect; normal thought process  Ortho Exam Right hand: Small finger - DIP remains supple, passive range of motion is full - Notable flexion deformity at the DIP, unable to perform active extension - Skin  is intact, associated tenderness over the dorsal aspect of the DIP - Digit remains warm well-perfused, sensation is intact distally - PIP with appropriate range of motion, no evidence of hyperextension deformity  Imaging: XR Finger Little Right Result Date: 11/22/2023 X-rays of the right small finger were obtained today, multiple views X-rays demonstrate bony mallet injury of the distal phalanx, bony fragment displaced and extended, DIP held in a flexed posture.  No evidence of joint subluxation at the DIP.   Past Medical/Family/Surgical/Social History: Medications & Allergies reviewed per EMR, new medications updated. Patient Active Problem List   Diagnosis Date Noted   Pancreatitis 09/28/2019   Acute gallstone pancreatitis 09/28/2019   Abnormal liver function 09/28/2019   GERD (gastroesophageal reflux disease) 09/28/2019   Abdominal pain, epigastric 09/20/2019   Depression 07/10/2019   History of melanoma 07/10/2019   Eating disorder 10/03/2012   Stress 10/03/2012   Screening for breast cancer 10/03/2012   Past Medical History:  Diagnosis Date   Depression    Eating disorder    GERD (gastroesophageal reflux disease)    Hypercholesteremia    Hypertension    Melanoma (HCC)    left leg   Family History  Problem Relation Age of Onset   Colon cancer Mother 23       colon    Hypertension Father    Hyperlipidemia Father    Liver disease Neg Hx    Past Surgical History:  Procedure Laterality Date   CHOLECYSTECTOMY N/A 10/25/2019   Procedure: LAPAROSCOPIC CHOLECYSTECTOMY;  Surgeon: Donna Manuelita BROCKS, MD;  Location: [facility];  Service: General;  Laterality: N/A;   COLONOSCOPY WITH PROPOFOL  N/A 01/23/2022   Procedure: COLONOSCOPY WITH PROPOFOL ;  Surgeon: Donna Lamar HERO, MD;  Location: [facility];  Service: Endoscopy;  Laterality: N/A;  9:00 / ASA 2   ERCP N/A 09/29/2019   Procedure: ENDOSCOPIC RETROGRADE CHOLANGIOPANCREATOGRAPHY (ERCP) WITH SPHINCTEROTOMY AND STONE  EXTRACTION;  Surgeon: Golda Donna PENNER, MD;  Location: [facility];  Service: Endoscopy;  Laterality: N/A;   TUBAL LIGATION  1997   Social History   Occupational History   Not on file  Tobacco Use   Smoking status: Never   Smokeless tobacco: Never  Vaping Use   Vaping status: Never Used  Substance and Sexual Activity   Alcohol use: Not Currently    Comment: stopped drinking 2 months ago as of 10/23/2019   Drug use: No   Sexual activity: Yes    Partners: Male    Franki Alcaide Estela) Arlinda, M.D.  [facility] 8:46 AM

## 2023-12-14 ENCOUNTER — Ambulatory Visit: Payer: Commercial Managed Care - PPO | Admitting: Orthopedic Surgery

## 2023-12-14 DIAGNOSIS — M25511 Pain in right shoulder: Secondary | ICD-10-CM | POA: Diagnosis not present

## 2023-12-15 ENCOUNTER — Other Ambulatory Visit (HOSPITAL_COMMUNITY): Payer: Self-pay | Admitting: Family Medicine

## 2023-12-15 ENCOUNTER — Ambulatory Visit (HOSPITAL_COMMUNITY)
Admission: RE | Admit: 2023-12-15 | Discharge: 2023-12-15 | Disposition: A | Discharge status: Home / Self Care | Payer: Commercial Managed Care - PPO | Source: Ambulatory Visit | Attending: Family Medicine | Admitting: Family Medicine

## 2023-12-15 DIAGNOSIS — M25511 Pain in right shoulder: Secondary | ICD-10-CM | POA: Insufficient documentation

## 2023-12-20 ENCOUNTER — Ambulatory Visit: Payer: Commercial Managed Care - PPO | Admitting: Orthopedic Surgery

## 2023-12-28 DIAGNOSIS — Z7182 Exercise counseling: Secondary | ICD-10-CM | POA: Diagnosis not present

## 2023-12-28 DIAGNOSIS — E669 Obesity, unspecified: Secondary | ICD-10-CM | POA: Diagnosis not present

## 2023-12-28 DIAGNOSIS — M25511 Pain in right shoulder: Secondary | ICD-10-CM | POA: Diagnosis not present

## 2023-12-28 DIAGNOSIS — Z713 Dietary counseling and surveillance: Secondary | ICD-10-CM | POA: Diagnosis not present

## 2024-01-10 ENCOUNTER — Other Ambulatory Visit (INDEPENDENT_AMBULATORY_CARE_PROVIDER_SITE_OTHER): Payer: Self-pay

## 2024-01-10 ENCOUNTER — Ambulatory Visit: Payer: Commercial Managed Care - PPO | Admitting: Orthopedic Surgery

## 2024-01-10 DIAGNOSIS — M20011 Mallet finger of right finger(s): Secondary | ICD-10-CM

## 2024-01-11 NOTE — Progress Notes (Signed)
 Donna Vega - 58 y.o. female MRN [id number]  Date of birth: 1966/07/20  Office Visit Note: Visit Date: 01/10/2024 PCP: Shelby Dubin, FNP Referred by: Shelby Dubin, FNP  Subjective: No chief complaint on file.  HPI: Donna Vega is a pleasant 58 y.o. female who presents today for follow-up of right small finger mallet injury being treated with mallet splint.  She has been compliant with the splint for approximately 8 weeks.  Treatment was initially delayed from her initial injury but approximately 2 months before she was placed into a splint.  Currently, her pain is well-controlled, no significant flexion deformity notable at the DIP region of the small finger currently.  She is pleased with her results.  Pertinent ROS were reviewed with the patient and found to be negative unless otherwise specified above in HPI.    Assessment & Plan: Visit Diagnoses:  1. Acquired mallet finger of right hand     Plan: She is demonstrating appropriate healing of the bony mallet injury both clinically and radiographically.  At this juncture, she can discontinue use of the mallet splint at all times.  I did recommend that she can continue utilizing it as needed over the next few weeks as she returns activities.  She expressed full understanding, will return to me as needed.  Follow-up: No follow-ups on file.   Meds & Orders: No orders of the defined types were placed in this encounter.   Orders Placed This Encounter  Procedures   XR Finger Little Right     Procedures: No procedures performed      Clinical History: No specialty comments available.  She reports that she has never smoked. She has never used smokeless tobacco. No results for input(s): "HGBA1C", "LABURIC" in the last 8760 hours.  Objective:   Vital Signs: There were no vitals taken for this visit.  Physical Exam  Gen: Well-appearing, in no acute distress; non-toxic CV: Regular Rate. Well-perfused. Warm.  Resp:  Breathing unlabored on room air; no wheezing. Psych: Fluid speech in conversation; appropriate affect; normal thought process  Ortho Exam Right hand: Small finger - DIP remains supple, passive range of motion is full - No flexion deformity at the DIP, able to perform active flexion extension - Skin is intact, no tenderness over the dorsal aspect of the DIP - Digit remains warm well-perfused, sensation is intact distally - PIP with appropriate range of motion, no evidence of hyperextension deformity  Imaging: X-rays of the small finger, multiple views were performed X-rays demonstrate known bony mallet injury with remodeling, DIP remains well located in all planes.   Past Medical/Family/Surgical/Social History: Medications & Allergies reviewed per EMR, new medications updated. Patient Active Problem List   Diagnosis Date Noted   Pancreatitis 09/28/2019   Acute gallstone pancreatitis 09/28/2019   Abnormal liver function 09/28/2019   GERD (gastroesophageal reflux disease) 09/28/2019   Abdominal pain, epigastric 09/20/2019   Depression 07/10/2019   History of melanoma 07/10/2019   Eating disorder 10/03/2012   Stress 10/03/2012   Screening for breast cancer 10/03/2012   Past Medical History:  Diagnosis Date   Depression    Eating disorder    GERD (gastroesophageal reflux disease)    Hypercholesteremia    Hypertension    Melanoma (HCC)    left leg   Family History  Problem Relation Age of Onset   Colon cancer Mother 87       colon    Hypertension Father    Hyperlipidemia Father  Liver disease Neg Hx    Past Surgical History:  Procedure Laterality Date   CHOLECYSTECTOMY N/A 10/25/2019   Procedure: LAPAROSCOPIC CHOLECYSTECTOMY;  Surgeon: Lucretia Roers, MD;  Location: [facility];  Service: General;  Laterality: N/A;   COLONOSCOPY WITH PROPOFOL N/A 01/23/2022   Procedure: COLONOSCOPY WITH PROPOFOL;  Surgeon: Corbin Ade, MD;  Location: [facility];  Service:  Endoscopy;  Laterality: N/A;  9:00 / ASA 2   ERCP N/A 09/29/2019   Procedure: ENDOSCOPIC RETROGRADE CHOLANGIOPANCREATOGRAPHY (ERCP) WITH SPHINCTEROTOMY AND STONE EXTRACTION;  Surgeon: Malissa Hippo, MD;  Location: [facility];  Service: Endoscopy;  Laterality: N/A;   TUBAL LIGATION  1997   Social History   Occupational History   Not on file  Tobacco Use   Smoking status: Never   Smokeless tobacco: Never  Vaping Use   Vaping status: Never Used  Substance and Sexual Activity   Alcohol use: Not Currently    Comment: stopped drinking 2 months ago as of 10/23/2019   Drug use: No   Sexual activity: Yes    Partners: Male    Elyjah Hazan Fara Boros) Denese Killings, M.D. [facility] 3:06 PM

## 2024-01-18 ENCOUNTER — Ambulatory Visit: Payer: Commercial Managed Care - PPO | Admitting: Orthopedic Surgery

## 2024-01-28 ENCOUNTER — Encounter (HOSPITAL_COMMUNITY): Payer: Self-pay | Admitting: Occupational Therapy

## 2024-01-28 ENCOUNTER — Ambulatory Visit (HOSPITAL_COMMUNITY): Attending: Family Medicine | Admitting: Occupational Therapy

## 2024-01-28 DIAGNOSIS — M25611 Stiffness of right shoulder, not elsewhere classified: Secondary | ICD-10-CM | POA: Insufficient documentation

## 2024-01-28 DIAGNOSIS — M25511 Pain in right shoulder: Secondary | ICD-10-CM | POA: Diagnosis not present

## 2024-01-28 DIAGNOSIS — R29898 Other symptoms and signs involving the musculoskeletal system: Secondary | ICD-10-CM | POA: Diagnosis not present

## 2024-01-28 NOTE — Patient Instructions (Signed)

## 2024-01-28 NOTE — Therapy (Signed)
 OUTPATIENT OCCUPATIONAL THERAPY ORTHO EVALUATION  Patient Name: Donna Vega MRN: [id number] DOB:[birth year], 58 y.o., female Today's Date: 01/28/2024   END OF SESSION:  OT End of Session - 01/28/24 1610     Visit Number 1    Number of Visits 7    Date for OT Re-Evaluation 03/17/24    Authorization Type [employer], Copay $25    OT Start Time 1509    OT Stop Time 1557    OT Time Calculation (min) 48 min    Activity Tolerance Patient tolerated treatment well    Behavior During Therapy WFL for tasks assessed/performed             Past Medical History:  Diagnosis Date   Depression    Eating disorder    GERD (gastroesophageal reflux disease)    Hypercholesteremia    Hypertension    Melanoma (HCC)    left leg   Past Surgical History:  Procedure Laterality Date   CHOLECYSTECTOMY N/A 10/25/2019   Procedure: LAPAROSCOPIC CHOLECYSTECTOMY;  Surgeon: Lucretia Roers, MD;  Location: [facility];  Service: General;  Laterality: N/A;   COLONOSCOPY WITH PROPOFOL N/A 01/23/2022   Procedure: COLONOSCOPY WITH PROPOFOL;  Surgeon: Corbin Ade, MD;  [facility];  Service: Endoscopy;  Laterality: N/A;  9:00 / ASA 2   ERCP N/A 09/29/2019   Procedure: ENDOSCOPIC RETROGRADE CHOLANGIOPANCREATOGRAPHY (ERCP) WITH SPHINCTEROTOMY AND STONE EXTRACTION;  Surgeon: Malissa Hippo, MD;  Location: [facility];  Service: Endoscopy;  Laterality: N/A;   TUBAL LIGATION  1997   Patient Active Problem List   Diagnosis Date Noted   Pancreatitis 09/28/2019   Acute gallstone pancreatitis 09/28/2019   Abnormal liver function 09/28/2019   GERD (gastroesophageal reflux disease) 09/28/2019   Abdominal pain, epigastric 09/20/2019   Depression 07/10/2019   History of melanoma 07/10/2019   Eating disorder 10/03/2012   Stress 10/03/2012   Screening for breast cancer 10/03/2012    PCP: Colvin Caroli, FNP REFERRING PROVIDER: Colvin Caroli, FNP  ONSET DATE: 10/2023  REFERRING DIAG: M25.511  (ICD-10-CM) - Pain in right shoulder   THERAPY DIAG: Acute pain of right shoulder - Plan: Ot plan of care cert/re-cert  Shoulder stiffness, right - Plan: Ot plan of care cert/re-cert  Other symptoms and signs involving the musculoskeletal system - Plan: Ot plan of care cert/re-cert  Rationale for Evaluation and Treatment: Rehabilitation  SUBJECTIVE:   SUBJECTIVE STATEMENT: "It gets so bad sometimes I just cry" Pt accompanied by: self  PERTINENT HISTORY: Pt reports no trauma's, falls, or other injuries to her arm. She broke her pinky finger back in December, however has no connection to shoulder. Pain and ROM have been worsening steadily since mid December 2024. At this time she has no Orthopedic MD referral, Xray was negative.  PRECAUTIONS: None  WEIGHT BEARING RESTRICTIONS: No  PAIN:  Are you having pain? Yes: NPRS scale: 7/10 Pain location: deltoid/biceps Pain description: throbbing Aggravating factors: movement Relieving factors: heat/ice  FALLS: Has patient fallen in last 6 months? No  PLOF: Independent  PATIENT GOALS: "I want to be able to move my arm again"  NEXT MD VISIT: May 2025  OBJECTIVE:   HAND DOMINANCE: Right  ADLs: Overall ADLs: Pt is unable to lift her arm up to fix her hair, she can only use L hand to wash her hair. Pt has mod to max difficulty putting on a bra or taking a shirt off. Pt moderately limited with cooking and cleaning due to limited ROM  and weakness. Pt not able to use RUE with driving. She also reports max difficulty with sleeping, unable to sleep more than a few hours at a time, if that.   FUNCTIONAL OUTCOME MEASURES: Upper Extremity Functional Scale (UEFS): 35/80 - 43.8%  UPPER EXTREMITY ROM:       Assessed in seated, er/IR adducted  Active ROM Right eval  Shoulder flexion 93  Shoulder abduction 69  Shoulder internal rotation 90  Shoulder external rotation -13  (Blank rows = not tested)    UPPER EXTREMITY MMT:      Assessed in seated, er/IR adducted  MMT Right eval  Shoulder flexion 4/5 *w/ Pain  Shoulder abduction 4/5 * w/Pain  Shoulder internal rotation 4/5  Shoulder external rotation 4/5  (Blank rows = not tested)  SENSATION: WFL  EDEMA: No swelling noted  OBSERVATIONS: moderate fascial restrictions along biceps, trapezius, scapular region   TODAY'S TREATMENT:                                                                                                                              DATE:   01/28/24 -Evaluation -Measurements -Table Slides: flexion, abduction, x10 -Pendulums 2x30"   PATIENT EDUCATION: Education details: Table Slides and Pendulums Person educated: Patient Education method: Explanation, Demonstration, and Handouts Education comprehension: verbalized understanding and returned demonstration  HOME EXERCISE PROGRAM: 01/28/24: Table Slides and Pendulums  GOALS: Goals reviewed with patient? Yes   SHORT TERM GOALS: Target date: 03/17/24  Pt will be provided with and educated on HEP to improve mobility in RUE required for use during ADL completion.   LONG TERM GOALS: Target date: 03/17/24  Pt will decrease pain in RUE to 3/10 or less to improve ability to sleep for 2+ consecutive hours without waking due to pain.   Goal status: INITIAL  2.  Pt will decrease RUE fascial restrictions to min amounts or less to improve mobility required for functional reaching tasks.   Goal status: INITIAL  3.  Pt will increase RUE A/ROM by 30+ degrees to improve ability to use RUE when reaching overhead or behind back during dressing and bathing tasks.   Goal status: INITIAL  4.  Pt will increase RUE strength to 4+/5 or greater to improve ability to use RUE when lifting or carrying items during meal preparation/housework/yardwork tasks.   Goal status: INITIAL  5.  Pt will return to highest level of function using RUE as dominant during functional task completion.   Goal  status: INITIAL   ASSESSMENT:  CLINICAL IMPRESSION: Patient is a 58 y.o. female who was seen today for occupational therapy evaluation for R shoulder pain. Pt presents with increased pain and fascial restrictions, decreased ROM, strength, and functional use of the RUE.   PERFORMANCE DEFICITS: in functional skills including in functional skills including ADLs, IADLs, coordination, tone, ROM, strength, pain, fascial restrictions, muscle spasms, and UE functional use.   IMPAIRMENTS: are limiting patient from ADLs, IADLs, rest and  sleep, work, leisure, and social participation.   COMORBIDITIES: has no other co-morbidities that affects occupational performance. Patient will benefit from skilled OT to address above impairments and improve overall function.  MODIFICATION OR ASSISTANCE TO COMPLETE EVALUATION: No modification of tasks or assist necessary to complete an evaluation.  OT OCCUPATIONAL PROFILE AND HISTORY: Problem focused assessment: Including review of records relating to presenting problem.  CLINICAL DECISION MAKING: LOW - limited treatment options, no task modification necessary  REHAB POTENTIAL: Good  EVALUATION COMPLEXITY: Low      PLAN:  OT FREQUENCY: 1-2x/week  OT DURATION: 6 weeks  PLANNED INTERVENTIONS: 97168 OT Re-evaluation, 97535 self care/ADL training, 24401 therapeutic exercise, 97530 therapeutic activity, 97112 neuromuscular re-education, 97140 manual therapy, 97035 ultrasound, 97010 moist heat, 97032 electrical stimulation (manual), passive range of motion, functional mobility training, energy conservation, coping strategies training, patient/family education, and DME and/or AE instructions  RECOMMENDED OTHER SERVICES: Orthopedic Referral, MRI  CONSULTED AND AGREED WITH PLAN OF CARE: Patient  PLAN FOR NEXT SESSION: Manual Therapy, P/ROM, Isometrics   Maddoxx Burkitt Bing Plume, OTR/L [facility] [phone] Dorman Calderwood Rosemarie Beath, OT 01/28/2024, 4:12  PM

## 2024-02-01 ENCOUNTER — Other Ambulatory Visit (HOSPITAL_COMMUNITY): Payer: Self-pay | Admitting: Family Medicine

## 2024-02-01 DIAGNOSIS — M25511 Pain in right shoulder: Secondary | ICD-10-CM

## 2024-02-04 ENCOUNTER — Encounter (HOSPITAL_COMMUNITY): Payer: Self-pay | Admitting: Occupational Therapy

## 2024-02-04 ENCOUNTER — Ambulatory Visit (HOSPITAL_COMMUNITY): Admitting: Occupational Therapy

## 2024-02-04 DIAGNOSIS — M25611 Stiffness of right shoulder, not elsewhere classified: Secondary | ICD-10-CM | POA: Diagnosis not present

## 2024-02-04 DIAGNOSIS — R29898 Other symptoms and signs involving the musculoskeletal system: Secondary | ICD-10-CM | POA: Diagnosis not present

## 2024-02-04 DIAGNOSIS — M25511 Pain in right shoulder: Secondary | ICD-10-CM

## 2024-02-04 NOTE — Therapy (Signed)
 OUTPATIENT OCCUPATIONAL THERAPY ORTHO TREATMENT  Patient Name: Donna Vega MRN: [id number] DOB:06/18/1966, 58 y.o., female Today's Date: 02/04/2024   END OF SESSION:  OT End of Session - 02/04/24 1507     Visit Number 2    Number of Visits 7    Date for OT Re-Evaluation 03/17/24    Authorization Type [employer], Copay $25    OT Start Time 1430    OT Stop Time 1510    OT Time Calculation (min) 40 min    Activity Tolerance Patient tolerated treatment well    Behavior During Therapy WFL for tasks assessed/performed              Past Medical History:  Diagnosis Date   Depression    Eating disorder    GERD (gastroesophageal reflux disease)    Hypercholesteremia    Hypertension    Melanoma (HCC)    left leg   Past Surgical History:  Procedure Laterality Date   CHOLECYSTECTOMY N/A 10/25/2019   Procedure: LAPAROSCOPIC CHOLECYSTECTOMY;  Surgeon: Lucretia Roers, MD;  Location: [facility];  Service: General;  Laterality: N/A;   COLONOSCOPY WITH PROPOFOL N/A 01/23/2022   Procedure: COLONOSCOPY WITH PROPOFOL;  Surgeon: Corbin Ade, MD;  [facility];  Service: Endoscopy;  Laterality: N/A;  9:00 / ASA 2   ERCP N/A 09/29/2019   Procedure: ENDOSCOPIC RETROGRADE CHOLANGIOPANCREATOGRAPHY (ERCP) WITH SPHINCTEROTOMY AND STONE EXTRACTION;  Surgeon: Malissa Hippo, MD;  Location: [facility];  Service: Endoscopy;  Laterality: N/A;   TUBAL LIGATION  1997   Patient Active Problem List   Diagnosis Date Noted   Pancreatitis 09/28/2019   Acute gallstone pancreatitis 09/28/2019   Abnormal liver function 09/28/2019   GERD (gastroesophageal reflux disease) 09/28/2019   Abdominal pain, epigastric 09/20/2019   Depression 07/10/2019   History of melanoma 07/10/2019   Eating disorder 10/03/2012   Stress 10/03/2012   Screening for breast cancer 10/03/2012    PCP: Colvin Caroli, FNP REFERRING PROVIDER: Colvin Caroli, FNP  ONSET DATE: 10/2023  REFERRING DIAG: M25.511  (ICD-10-CM) - Pain in right shoulder   THERAPY DIAG: Acute pain of right shoulder  Shoulder stiffness, right  Other symptoms and signs involving the musculoskeletal system  Rationale for Evaluation and Treatment: Rehabilitation  SUBJECTIVE:   SUBJECTIVE STATEMENT: "It woke me up at 3am hurting " Pt accompanied by: self  PERTINENT HISTORY: Pt reports no trauma's, falls, or other injuries to her arm. She broke her pinky finger back in December, however has no connection to shoulder. Pain and ROM have been worsening steadily since mid December 2024. At this time she has no Orthopedic MD referral, Xray was negative.  PRECAUTIONS: None  WEIGHT BEARING RESTRICTIONS: No  PAIN:  Are you having pain? Yes: NPRS scale: 7/10 Pain location: deltoid/biceps Pain description: throbbing Aggravating factors: movement Relieving factors: heat/ice  FALLS: Has patient fallen in last 6 months? No  PLOF: Independent  PATIENT GOALS: "I want to be able to move my arm again"  NEXT MD VISIT: May 2025  OBJECTIVE:   HAND DOMINANCE: Right  ADLs: Overall ADLs: Pt is unable to lift her arm up to fix her hair, she can only use L hand to wash her hair. Pt has mod to max difficulty putting on a bra or taking a shirt off. Pt moderately limited with cooking and cleaning due to limited ROM and weakness. Pt not able to use RUE with driving. She also reports max difficulty with sleeping, unable to sleep  more than a few hours at a time, if that.   FUNCTIONAL OUTCOME MEASURES: Upper Extremity Functional Scale (UEFS): 35/80 - 43.8%  UPPER EXTREMITY ROM:       Assessed in seated, er/IR adducted  Active ROM Right eval  Shoulder flexion 93  Shoulder abduction 69  Shoulder internal rotation 90  Shoulder external rotation -13  (Blank rows = not tested)    UPPER EXTREMITY MMT:     Assessed in seated, er/IR adducted  MMT Right eval  Shoulder flexion 4/5 *w/ Pain  Shoulder abduction 4/5 * w/Pain   Shoulder internal rotation 4/5  Shoulder external rotation 4/5  (Blank rows = not tested)  SENSATION: WFL  EDEMA: No swelling noted  OBSERVATIONS: moderate fascial restrictions along biceps, trapezius, scapular region   TODAY'S TREATMENT:                                                                                                                              DATE:   02/04/24 -Manual techniques: to deltoid, anterior shoulder, trapezius to decrease fascial restrictions and improve ROM decrease pain  -P/ROM: supine; flexion, abduction, horizontal abduction, internal/external rotation 10x each  -AA/ROM: supine; flexion, abduction, horizontal abduction, internal/external rotation 10x each  -Isometric strengthening: flexion, abduction, internal/external rotation holding 30 seconds 2 sets each  -UBE: 3 mins forwards 3 mins backwards 4 KPH level 1   01/28/24 -Evaluation -Measurements -Table Slides: flexion, abduction, x10 -Pendulums 2x30"   PATIENT EDUCATION: Education details: Table Slides and Pendulums Person educated: Patient Education method: Explanation, Demonstration, and Handouts Education comprehension: verbalized understanding and returned demonstration  HOME EXERCISE PROGRAM: 01/28/24: Table Slides and Pendulums 02/04/24: AA/ROM supine  GOALS: Goals reviewed with patient? Yes   SHORT TERM GOALS: Target date: 03/17/24  Pt will be provided with and educated on HEP to improve mobility in RUE required for use during ADL completion.   LONG TERM GOALS: Target date: 03/17/24  Pt will decrease pain in RUE to 3/10 or less to improve ability to sleep for 2+ consecutive hours without waking due to pain.   Goal status: INITIAL  2.  Pt will decrease RUE fascial restrictions to min amounts or less to improve mobility required for functional reaching tasks.   Goal status: INITIAL  3.  Pt will increase RUE A/ROM by 30+ degrees to improve ability to use RUE when reaching  overhead or behind back during dressing and bathing tasks.   Goal status: INITIAL  4.  Pt will increase RUE strength to 4+/5 or greater to improve ability to use RUE when lifting or carrying items during meal preparation/housework/yardwork tasks.   Goal status: INITIAL  5.  Pt will return to highest level of function using RUE as dominant during functional task completion.   Goal status: INITIAL   ASSESSMENT:  CLINICAL IMPRESSION: Pt reports that she continues to have increased levels of pain and it is waking her up from sleep at night. She demonstrates 50%  AA/ROM in supine and reports increased pain with external rotation and abduction. Pt unable to externally rotate RUE behind back in standing as well- reports pain with this movement. Initiated AA/ROM and isometric strengthening.   PERFORMANCE DEFICITS: in functional skills including in functional skills including ADLs, IADLs, coordination, tone, ROM, strength, pain, fascial restrictions, muscle spasms, and UE functional use.   IMPAIRMENTS: are limiting patient from ADLs, IADLs, rest and sleep, work, leisure, and social participation.   COMORBIDITIES: has no other co-morbidities that affects occupational performance. Patient will benefit from skilled OT to address above impairments and improve overall function.  MODIFICATION OR ASSISTANCE TO COMPLETE EVALUATION: No modification of tasks or assist necessary to complete an evaluation.  OT OCCUPATIONAL PROFILE AND HISTORY: Problem focused assessment: Including review of records relating to presenting problem.  CLINICAL DECISION MAKING: LOW - limited treatment options, no task modification necessary  REHAB POTENTIAL: Good  EVALUATION COMPLEXITY: Low      PLAN:  OT FREQUENCY: 1-2x/week  OT DURATION: 6 weeks  PLANNED INTERVENTIONS: 97168 OT Re-evaluation, 97535 self care/ADL training, 40981 therapeutic exercise, 97530 therapeutic activity, 97112 neuromuscular re-education,  97140 manual therapy, 97035 ultrasound, 97010 moist heat, 97032 electrical stimulation (manual), passive range of motion, functional mobility training, energy conservation, coping strategies training, patient/family education, and DME and/or AE instructions  RECOMMENDED OTHER SERVICES: Orthopedic Referral, MRI  CONSULTED AND AGREED WITH PLAN OF CARE: Patient  PLAN FOR NEXT SESSION: Manual Therapy, P/ROM, Isometrics   [facility] [phone] Bevelyn Ngo, OTR/L 02/04/2024, 3:08 PM

## 2024-02-04 NOTE — Patient Instructions (Signed)

## 2024-02-08 ENCOUNTER — Ambulatory Visit (HOSPITAL_COMMUNITY)
Admission: RE | Admit: 2024-02-08 | Discharge: 2024-02-08 | Disposition: A | Discharge status: Home / Self Care | Source: Ambulatory Visit | Attending: Family Medicine | Admitting: Family Medicine

## 2024-02-08 DIAGNOSIS — M75101 Unspecified rotator cuff tear or rupture of right shoulder, not specified as traumatic: Secondary | ICD-10-CM | POA: Diagnosis not present

## 2024-02-08 DIAGNOSIS — M19011 Primary osteoarthritis, right shoulder: Secondary | ICD-10-CM | POA: Diagnosis not present

## 2024-02-08 DIAGNOSIS — M948X1 Other specified disorders of cartilage, shoulder: Secondary | ICD-10-CM | POA: Diagnosis not present

## 2024-02-08 DIAGNOSIS — M25511 Pain in right shoulder: Secondary | ICD-10-CM | POA: Insufficient documentation

## 2024-02-11 ENCOUNTER — Encounter (HOSPITAL_COMMUNITY): Admitting: Occupational Therapy

## 2024-02-18 ENCOUNTER — Encounter (HOSPITAL_COMMUNITY): Payer: Self-pay | Admitting: Occupational Therapy

## 2024-02-18 ENCOUNTER — Ambulatory Visit (HOSPITAL_COMMUNITY): Attending: Family Medicine | Admitting: Occupational Therapy

## 2024-02-18 DIAGNOSIS — M25511 Pain in right shoulder: Secondary | ICD-10-CM | POA: Insufficient documentation

## 2024-02-18 DIAGNOSIS — R29898 Other symptoms and signs involving the musculoskeletal system: Secondary | ICD-10-CM | POA: Insufficient documentation

## 2024-02-18 DIAGNOSIS — M25611 Stiffness of right shoulder, not elsewhere classified: Secondary | ICD-10-CM | POA: Insufficient documentation

## 2024-02-18 NOTE — Therapy (Signed)
 OUTPATIENT OCCUPATIONAL THERAPY ORTHO TREATMENT  Patient Name: Donna Vega MRN: [id number] DOB:[birth year], 58 y.o., female Today's Date: 02/18/2024   END OF SESSION:  OT End of Session - 02/18/24 1418     Visit Number 3    Number of Visits 7    Date for OT Re-Evaluation 03/17/24    Authorization Type [employer], Copay $25    OT Start Time 1345    OT Stop Time 1425    OT Time Calculation (min) 40 min    Activity Tolerance Patient tolerated treatment well    Behavior During Therapy WFL for tasks assessed/performed               Past Medical History:  Diagnosis Date   Depression    Eating disorder    GERD (gastroesophageal reflux disease)    Hypercholesteremia    Hypertension    Melanoma (HCC)    left leg   Past Surgical History:  Procedure Laterality Date   CHOLECYSTECTOMY N/A 10/25/2019   Procedure: LAPAROSCOPIC CHOLECYSTECTOMY;  Surgeon: Lucretia Roers, MD;  Location: [facility];  Service: General;  Laterality: N/A;   COLONOSCOPY WITH PROPOFOL N/A 01/23/2022   Procedure: COLONOSCOPY WITH PROPOFOL;  Surgeon: Corbin Ade, MD;  Location: [facility];  Service: Endoscopy;  Laterality: N/A;  9:00 / ASA 2   ERCP N/A 09/29/2019   Procedure: ENDOSCOPIC RETROGRADE CHOLANGIOPANCREATOGRAPHY (ERCP) WITH SPHINCTEROTOMY AND STONE EXTRACTION;  Surgeon: Malissa Hippo, MD;  Location: [facility];  Service: Endoscopy;  Laterality: N/A;   TUBAL LIGATION  1997   Patient Active Problem List   Diagnosis Date Noted   Pancreatitis 09/28/2019   Acute gallstone pancreatitis 09/28/2019   Abnormal liver function 09/28/2019   GERD (gastroesophageal reflux disease) 09/28/2019   Abdominal pain, epigastric 09/20/2019   Depression 07/10/2019   History of melanoma 07/10/2019   Eating disorder 10/03/2012   Stress 10/03/2012   Screening for breast cancer 10/03/2012    PCP: Colvin Caroli, FNP REFERRING PROVIDER: Colvin Caroli, FNP  ONSET DATE: 10/2023  REFERRING DIAG: M25.511  (ICD-10-CM) - Pain in right shoulder   THERAPY DIAG: Acute pain of right shoulder  Other symptoms and signs involving the musculoskeletal system  Shoulder stiffness, right  Rationale for Evaluation and Treatment: Rehabilitation  SUBJECTIVE:   SUBJECTIVE STATEMENT: "It hurts when I use my mouse at work " Pt accompanied by: self  PERTINENT HISTORY: Pt reports no trauma's, falls, or other injuries to her arm. She broke her pinky finger back in December, however has no connection to shoulder. Pain and ROM have been worsening steadily since mid December 2024. At this time she has no Orthopedic MD referral, Xray was negative.  PRECAUTIONS: None  WEIGHT BEARING RESTRICTIONS: No  PAIN:  Are you having pain? Yes: NPRS scale: 7/10 Pain location: deltoid/biceps Pain description: throbbing Aggravating factors: movement Relieving factors: heat/ice  FALLS: Has patient fallen in last 6 months? No  PLOF: Independent  PATIENT GOALS: "I want to be able to move my arm again"  NEXT MD VISIT: May 2025  OBJECTIVE:   HAND DOMINANCE: Right  ADLs: Overall ADLs: Pt is unable to lift her arm up to fix her hair, she can only use L hand to wash her hair. Pt has mod to max difficulty putting on a bra or taking a shirt off. Pt moderately limited with cooking and cleaning due to limited ROM and weakness. Pt not able to use RUE with driving. She also reports max difficulty with sleeping,  unable to sleep more than a few hours at a time, if that.   FUNCTIONAL OUTCOME MEASURES: Upper Extremity Functional Scale (UEFS): 35/80 - 43.8%  UPPER EXTREMITY ROM:       Assessed in seated, er/IR adducted  Active ROM Right eval  Shoulder flexion 93  Shoulder abduction 69  Shoulder internal rotation 90  Shoulder external rotation -13  (Blank rows = not tested)    UPPER EXTREMITY MMT:     Assessed in seated, er/IR adducted  MMT Right eval  Shoulder flexion 4/5 *w/ Pain  Shoulder abduction 4/5 *  w/Pain  Shoulder internal rotation 4/5  Shoulder external rotation 4/5  (Blank rows = not tested)  SENSATION: WFL  EDEMA: No swelling noted  OBSERVATIONS: moderate fascial restrictions along biceps, trapezius, scapular region   TODAY'S TREATMENT:                                                                                                                              DATE:   02/18/24 -Manual techniques: to deltoid, anterior shoulder, trapezius to decrease fascial restrictions and improve ROM decrease pain  -P/ROM: supine; flexion, abduction, horizontal abduction, internal/external rotation 10x each  -AA/ROM: supine; flexion, abduction, horizontal abduction, internal/external rotation 10x each   -Low level wall wash: 30 seconds flexion/abduction 2 sets  - Theraball stretches: flexion, abduction 10x   02/04/24 -Manual techniques: to deltoid, anterior shoulder, trapezius to decrease fascial restrictions and improve ROM decrease pain  -P/ROM: supine; flexion, abduction, horizontal abduction, internal/external rotation 10x each  -AA/ROM: supine; flexion, abduction, horizontal abduction, internal/external rotation 10x each  -Isometric strengthening: flexion, abduction, internal/external rotation holding 30 seconds 2 sets each  -UBE: 3 mins forwards 3 mins backwards 4 KPH level 1   01/28/24 -Evaluation -Measurements -Table Slides: flexion, abduction, x10 -Pendulums 2x30"   PATIENT EDUCATION: Education details: Table Slides and Pendulums Person educated: Patient Education method: Explanation, Demonstration, and Handouts Education comprehension: verbalized understanding and returned demonstration  HOME EXERCISE PROGRAM: 01/28/24: Table Slides and Pendulums 02/04/24: AA/ROM supine  GOALS: Goals reviewed with patient? Yes   SHORT TERM GOALS: Target date: 03/17/24  Pt will be provided with and educated on HEP to improve mobility in RUE required for use during ADL completion.    LONG TERM GOALS: Target date: 03/17/24  Pt will decrease pain in RUE to 3/10 or less to improve ability to sleep for 2+ consecutive hours without waking due to pain.   Goal status: INITIAL  2.  Pt will decrease RUE fascial restrictions to min amounts or less to improve mobility required for functional reaching tasks.   Goal status: INITIAL  3.  Pt will increase RUE A/ROM by 30+ degrees to improve ability to use RUE when reaching overhead or behind back during dressing and bathing tasks.   Goal status: INITIAL  4.  Pt will increase RUE strength to 4+/5 or greater to improve ability to use RUE when lifting or carrying items during  meal preparation/housework/yardwork tasks.   Goal status: INITIAL  5.  Pt will return to highest level of function using RUE as dominant during functional task completion.   Goal status: INITIAL   ASSESSMENT:  CLINICAL IMPRESSION: Pt reports no changes in high pain levels. She stated that she feels numbness in pinky side of hand when she wakes up from sleeping some mornings. She is extremely guarded when providing P/ROM in supine position, able to achieve less than 50% ROM in abduction. She reports that with AA/ROM the stretch felt "good" even though it causes some pain- reports popping with abduction.   Added in low level wall washes this session.   PERFORMANCE DEFICITS: in functional skills including in functional skills including ADLs, IADLs, coordination, tone, ROM, strength, pain, fascial restrictions, muscle spasms, and UE functional use.   IMPAIRMENTS: are limiting patient from ADLs, IADLs, rest and sleep, work, leisure, and social participation.   COMORBIDITIES: has no other co-morbidities that affects occupational performance. Patient will benefit from skilled OT to address above impairments and improve overall function.  MODIFICATION OR ASSISTANCE TO COMPLETE EVALUATION: No modification of tasks or assist necessary to complete an  evaluation.  OT OCCUPATIONAL PROFILE AND HISTORY: Problem focused assessment: Including review of records relating to presenting problem.  CLINICAL DECISION MAKING: LOW - limited treatment options, no task modification necessary  REHAB POTENTIAL: Good  EVALUATION COMPLEXITY: Low      PLAN:  OT FREQUENCY: 1-2x/week  OT DURATION: 6 weeks  PLANNED INTERVENTIONS: 97168 OT Re-evaluation, 97535 self care/ADL training, 40981 therapeutic exercise, 97530 therapeutic activity, 97112 neuromuscular re-education, 97140 manual therapy, 97035 ultrasound, 97010 moist heat, 97032 electrical stimulation (manual), passive range of motion, functional mobility training, energy conservation, coping strategies training, patient/family education, and DME and/or AE instructions  RECOMMENDED OTHER SERVICES: Orthopedic Referral, MRI  CONSULTED AND AGREED WITH PLAN OF CARE: Patient  PLAN FOR NEXT SESSION: Manual Therapy, P/ROM, Isometrics   [facility] [phone] Bevelyn Ngo, OTR/L 02/18/2024, 2:31 PM

## 2024-02-23 ENCOUNTER — Encounter (HOSPITAL_COMMUNITY): Payer: Self-pay | Admitting: Occupational Therapy

## 2024-02-23 NOTE — Therapy (Signed)
 [facility] at [facility] [address] [place], [place], [address] Phone: [phone]   Fax:  [phone]  Patient Details  Name: PAISLY FINGERHUT MRN: [id number] Date of Birth: [birth year] Referring Provider:  No ref. provider found  Encounter Date: 02/23/2024  OCCUPATIONAL THERAPY DISCHARGE SUMMARY  Visits from Start of Care: 3  Current functional level related to goals / functional outcomes: Unknown. Pt canceled all appts as she is going to see an orthopedic MD.   Remaining deficits: Same   Education / Equipment: HEP   Patient agrees to discharge. Patient goals were not met. Patient is being discharged due to the patient's request..     Lafonda Piety, OTR/L  [phone] 02/23/2024, 2:45 PM  [facility] at [facility] [address] [place], [place], [address] Phone: [phone]   Fax:  [phone]

## 2024-02-25 ENCOUNTER — Encounter (HOSPITAL_COMMUNITY): Admitting: Occupational Therapy

## 2024-03-01 DIAGNOSIS — I10 Essential (primary) hypertension: Secondary | ICD-10-CM | POA: Insufficient documentation

## 2024-03-01 DIAGNOSIS — R7303 Prediabetes: Secondary | ICD-10-CM | POA: Insufficient documentation

## 2024-03-03 ENCOUNTER — Ambulatory Visit: Admitting: Orthopedic Surgery

## 2024-03-03 DIAGNOSIS — M7501 Adhesive capsulitis of right shoulder: Secondary | ICD-10-CM

## 2024-03-03 MED ORDER — HYDROCODONE-ACETAMINOPHEN 5-325 MG PO TABS: 1.0000 | ORAL_TABLET | Freq: Four times a day (QID) | ORAL | 0 refills | Status: AC | PRN

## 2024-03-03 NOTE — Progress Notes (Signed)
 Office Visit Note   Patient: Donna Vega           Date of Birth: [birth year]           MRN: [id number] Visit Date: 03/03/2024 Requested by: Gwenyth Leo, FNP [address] [place],  [place] [address] PCP: Bucio, Elsa C, FNP   Assessment & Plan:   Encounter Diagnosis  Name Primary?   Adhesive capsulitis of right shoulder Yes    Meds ordered this encounter  Medications   HYDROcodone -acetaminophen  (NORCO/VICODIN) 5-325 MG tablet    Sig: Take 1 tablet by mouth every 6 (six) hours as needed for moderate pain (pain score 4-6).    Dispense:  30 tablet    Refill:  65    58 year old female prediabetic on medication Glucophage with adhesive capsulitis  Recommend 12 more treatments with PT recheck If no improvement consider high injection procedure  Start hydrocodone  for pain  Meds ordered this encounter  Medications   HYDROcodone -acetaminophen  (NORCO/VICODIN) 5-325 MG tablet    Sig: Take 1 tablet by mouth every 6 (six) hours as needed for moderate pain (pain score 4-6).    Dispense:  30 tablet    Refill:  0   Continue naproxen twice a day   Subjective: Chief Complaint  Patient presents with   Shoulder Pain    Patient in today with right shoulder pain for 4 months now.  She has taken Tylenol , Advil and Naproxen for the pain none of which is helping.  Stopped physical therapy. She had 3 sessions and it became worse.    HPI: 58 year old female with diabetes presents with acute onset of gradually worsening and stiffening right shoulder.  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 Adhesive capsulitis of right shoulder      Follow-Up Instructions: Return CALL FOR APPT AFTER 15TH PT SESSION.    Objective: Vital Signs: There were no vitals taken for this visit.  Physical Exam Vitals and nursing note reviewed.  Constitutional:      Appearance: Normal appearance.  HENT:     Head: Normocephalic and atraumatic.  Eyes:     General: No scleral icterus.       Right eye: No discharge.        Left eye: No discharge.     Extraocular Movements: Extraocular movements intact.     Conjunctiva/sclera: Conjunctivae normal.     Pupils: Pupils are equal, round, and reactive to light.  Cardiovascular:     Rate and Rhythm: Normal rate.     Pulses: Normal pulses.  Skin:    General: Skin is warm and dry.     Capillary Refill: Capillary refill takes less than 2 seconds.  Neurological:     General: No focal deficit present.     Mental Status: She is alert and oriented to person, place, and time.  Psychiatric:  Mood and Affect: Mood normal.        Behavior: Behavior normal.        Thought Content: Thought content normal.        Judgment: Judgment normal.      Right Shoulder Exam   Range of Motion  Active abduction:  abnormal  Passive abduction:  abnormal  Extension:  normal  External rotation:  abnormal  Forward flexion:  abnormal   Other  Erythema: absent Sensation: normal Pulse: present  Comments:  Active and passive abduction 45  Extension 30  Arm at her side external rotation 10   Left Shoulder Exam  Left shoulder exam is normal.  Tenderness  The patient is experiencing no tenderness.   Range of Motion  The patient has normal left shoulder ROM.  Muscle Strength  The patient has normal left shoulder strength.  Tests  Apprehension: negative  Other  Erythema: absent Sensation: normal Pulse: present        Specialty Comments:  No specialty  comments available.  Imaging: No results found.   PMFS History: Patient Active Problem List   Diagnosis Date Noted   Hypertension 03/01/2024   Prediabetes 03/01/2024   Pancreatitis 09/28/2019   Acute gallstone pancreatitis 09/28/2019   Abnormal liver function 09/28/2019   GERD (gastroesophageal reflux disease) 09/28/2019   Abdominal pain, epigastric 09/20/2019   Depression 07/10/2019   History of melanoma 07/10/2019   Eating disorder 10/03/2012   Stress 10/03/2012   Screening for breast cancer 10/03/2012   Past Medical History:  Diagnosis Date   Depression    Eating disorder    GERD (gastroesophageal reflux disease)    Hypercholesteremia    Hypertension    Melanoma (HCC)    left leg    Family History  Problem Relation Age of Onset   Colon cancer Mother 39       colon    Hypertension Father    Hyperlipidemia Father    Liver disease Neg Hx     Past Surgical History:  Procedure Laterality Date   CHOLECYSTECTOMY N/A 10/25/2019   Procedure: LAPAROSCOPIC CHOLECYSTECTOMY;  Surgeon: Awilda Bogus, MD;  Location: [facility];  Service: General;  Laterality: N/A;   COLONOSCOPY WITH PROPOFOL  N/A 01/23/2022   Procedure: COLONOSCOPY WITH PROPOFOL ;  Surgeon: Suzette Espy, MD;  Location: [facility];  Service: Endoscopy;  Laterality: N/A;  9:00 / ASA 2   ERCP N/A 09/29/2019   Procedure: ENDOSCOPIC RETROGRADE CHOLANGIOPANCREATOGRAPHY (ERCP) WITH SPHINCTEROTOMY AND STONE EXTRACTION;  Surgeon: Ruby Corporal, MD;  Location: [facility];  Service: Endoscopy;  Laterality: N/A;   TUBAL LIGATION  1997   Social History   Occupational History   Not on file  Tobacco Use   Smoking status: Never   Smokeless tobacco: Never  Vaping Use   Vaping status: Never Used  Substance and Sexual Activity   Alcohol use: Not Currently    Comment: stopped drinking 2 months ago as of 10/23/2019   Drug use: No   Sexual activity: Yes    Partners: Male

## 2024-03-03 NOTE — Progress Notes (Signed)
  Intake history:  There were no vitals taken for this visit. There is no height or weight on file to calculate BMI.    WHAT ARE WE SEEING YOU FOR TODAY?   right shoulder  How long has this bothered you? (DOI?DOS?WS?)  4 month(s) ago  Anticoag.  No  Diabetes No  Heart disease No  Hypertension Yes  SMOKING HX No  Kidney disease No  Any ALLERGIES ______________________________________________   Treatment:  Have you taken:  Tylenol  Yes  Advil Yes  Had PT Yes  Had injection No  Other  _________________________

## 2024-03-10 ENCOUNTER — Encounter (HOSPITAL_COMMUNITY): Admitting: Occupational Therapy

## 2024-03-17 ENCOUNTER — Encounter (HOSPITAL_COMMUNITY): Admitting: Occupational Therapy

## 2024-05-30 DIAGNOSIS — Z713 Dietary counseling and surveillance: Secondary | ICD-10-CM | POA: Diagnosis not present

## 2024-05-30 DIAGNOSIS — E669 Obesity, unspecified: Secondary | ICD-10-CM | POA: Diagnosis not present

## 2024-05-30 DIAGNOSIS — E559 Vitamin D deficiency, unspecified: Secondary | ICD-10-CM | POA: Diagnosis not present

## 2024-05-30 DIAGNOSIS — E785 Hyperlipidemia, unspecified: Secondary | ICD-10-CM | POA: Diagnosis not present

## 2024-05-30 DIAGNOSIS — R7303 Prediabetes: Secondary | ICD-10-CM | POA: Diagnosis not present

## 2024-05-30 DIAGNOSIS — Z0000 Encounter for general adult medical examination without abnormal findings: Secondary | ICD-10-CM | POA: Diagnosis not present

## 2024-05-30 DIAGNOSIS — M7501 Adhesive capsulitis of right shoulder: Secondary | ICD-10-CM | POA: Diagnosis not present

## 2024-05-30 DIAGNOSIS — I10 Essential (primary) hypertension: Secondary | ICD-10-CM | POA: Diagnosis not present

## 2024-05-30 DIAGNOSIS — N1831 Chronic kidney disease, stage 3a: Secondary | ICD-10-CM | POA: Diagnosis not present

## 2024-05-30 DIAGNOSIS — F418 Other specified anxiety disorders: Secondary | ICD-10-CM | POA: Diagnosis not present

## 2024-07-28 ENCOUNTER — Other Ambulatory Visit (HOSPITAL_BASED_OUTPATIENT_CLINIC_OR_DEPARTMENT_OTHER): Payer: Self-pay

## 2024-07-28 MED ORDER — LOSARTAN POTASSIUM-HCTZ 50-12.5 MG PO TABS
1.0000 | ORAL_TABLET | Freq: Every day | ORAL | 5 refills | Status: AC
  Filled 2024-07-28: qty 30, 30d supply, fill #0
  Filled 2024-09-18: qty 30, 30d supply, fill #1
  Filled 2024-10-27 – 2024-11-08 (×2): qty 30, 30d supply, fill #2
  Filled 2024-12-07: qty 30, 30d supply, fill #3

## 2024-07-28 MED ORDER — METFORMIN HCL 500 MG PO TABS
500.0000 mg | ORAL_TABLET | Freq: Every day | ORAL | 5 refills | Status: AC
  Filled 2024-07-28: qty 30, 30d supply, fill #0
  Filled 2024-09-18: qty 30, 30d supply, fill #1
  Filled 2024-12-07: qty 30, 30d supply, fill #2

## 2024-08-30 ENCOUNTER — Other Ambulatory Visit (HOSPITAL_BASED_OUTPATIENT_CLINIC_OR_DEPARTMENT_OTHER): Payer: Self-pay

## 2024-08-30 MED ORDER — SODIUM FLUORIDE 1.1 % DT CREA
TOPICAL_CREAM | DENTAL | 6 refills | Status: AC
  Filled 2024-08-30 – 2024-09-18 (×2): qty 51, 30d supply, fill #0
  Filled 2024-10-27 – 2024-11-08 (×2): qty 51, 30d supply, fill #1

## 2024-09-11 ENCOUNTER — Other Ambulatory Visit (HOSPITAL_BASED_OUTPATIENT_CLINIC_OR_DEPARTMENT_OTHER): Payer: Self-pay

## 2024-09-11 ENCOUNTER — Encounter: Payer: Self-pay | Admitting: Radiology

## 2024-09-12 ENCOUNTER — Other Ambulatory Visit (HOSPITAL_BASED_OUTPATIENT_CLINIC_OR_DEPARTMENT_OTHER): Payer: Self-pay

## 2024-09-18 ENCOUNTER — Other Ambulatory Visit (HOSPITAL_BASED_OUTPATIENT_CLINIC_OR_DEPARTMENT_OTHER): Payer: Self-pay

## 2024-09-21 ENCOUNTER — Other Ambulatory Visit (HOSPITAL_BASED_OUTPATIENT_CLINIC_OR_DEPARTMENT_OTHER): Payer: Self-pay

## 2024-09-21 MED ORDER — BUPROPION HCL 100 MG PO TABS
100.0000 mg | ORAL_TABLET | Freq: Two times a day (BID) | ORAL | 3 refills | Status: AC
  Filled 2024-09-21: qty 60, 30d supply, fill #0
  Filled 2024-10-27 – 2024-11-08 (×2): qty 60, 30d supply, fill #1
  Filled 2024-12-07: qty 60, 30d supply, fill #2

## 2024-09-22 ENCOUNTER — Other Ambulatory Visit (HOSPITAL_BASED_OUTPATIENT_CLINIC_OR_DEPARTMENT_OTHER): Payer: Self-pay

## 2024-10-27 ENCOUNTER — Other Ambulatory Visit (HOSPITAL_BASED_OUTPATIENT_CLINIC_OR_DEPARTMENT_OTHER): Payer: Self-pay

## 2024-11-07 ENCOUNTER — Other Ambulatory Visit (HOSPITAL_BASED_OUTPATIENT_CLINIC_OR_DEPARTMENT_OTHER): Payer: Self-pay

## 2024-11-08 ENCOUNTER — Other Ambulatory Visit (HOSPITAL_BASED_OUTPATIENT_CLINIC_OR_DEPARTMENT_OTHER): Payer: Self-pay

## 2024-12-07 ENCOUNTER — Other Ambulatory Visit (HOSPITAL_BASED_OUTPATIENT_CLINIC_OR_DEPARTMENT_OTHER): Payer: Self-pay
